# Patient Record
Sex: Female | Born: 1955
Health system: Southern US, Community
[De-identification: ages and names within clinical notes are randomized; demographics above are authoritative.]

## PROBLEM LIST (undated history)

## (undated) DIAGNOSIS — Z8669 Personal history of other diseases of the nervous system and sense organs: Secondary | ICD-10-CM

## (undated) DIAGNOSIS — Z87898 Personal history of other specified conditions: Secondary | ICD-10-CM

## (undated) HISTORY — DX: Personal history of other diseases of the nervous system and sense organs: Z86.69

## (undated) HISTORY — DX: Personal history of other specified conditions: Z87.898

---

## 1999-02-25 ENCOUNTER — Emergency Department (HOSPITAL_COMMUNITY): Admission: EM | Admit: 1999-02-25 | Discharge: 1999-02-25 | Payer: Self-pay

## 2000-05-18 ENCOUNTER — Encounter: Payer: Self-pay | Admitting: Obstetrics and Gynecology

## 2000-05-18 ENCOUNTER — Encounter: Admission: RE | Admit: 2000-05-18 | Discharge: 2000-05-18 | Payer: Self-pay | Admitting: Obstetrics and Gynecology

## 2001-05-20 ENCOUNTER — Encounter: Payer: Self-pay | Admitting: Obstetrics and Gynecology

## 2001-05-20 ENCOUNTER — Encounter: Admission: RE | Admit: 2001-05-20 | Discharge: 2001-05-20 | Payer: Self-pay | Admitting: Obstetrics and Gynecology

## 2001-06-17 ENCOUNTER — Other Ambulatory Visit: Admission: RE | Admit: 2001-06-17 | Discharge: 2001-06-17 | Payer: Self-pay | Admitting: Obstetrics and Gynecology

## 2002-05-26 ENCOUNTER — Encounter: Admission: RE | Admit: 2002-05-26 | Discharge: 2002-05-26 | Payer: Self-pay | Admitting: Obstetrics and Gynecology

## 2002-05-26 ENCOUNTER — Encounter: Payer: Self-pay | Admitting: Obstetrics and Gynecology

## 2002-06-30 ENCOUNTER — Encounter: Payer: Self-pay | Admitting: Family Medicine

## 2002-06-30 LAB — CONVERTED CEMR LAB

## 2002-08-25 ENCOUNTER — Other Ambulatory Visit: Admission: RE | Admit: 2002-08-25 | Discharge: 2002-08-25 | Payer: Self-pay | Admitting: Obstetrics and Gynecology

## 2003-06-29 ENCOUNTER — Encounter: Admission: RE | Admit: 2003-06-29 | Discharge: 2003-06-29 | Payer: Self-pay | Admitting: Obstetrics and Gynecology

## 2003-11-03 ENCOUNTER — Other Ambulatory Visit: Admission: RE | Admit: 2003-11-03 | Discharge: 2003-11-03 | Payer: Self-pay | Admitting: Obstetrics and Gynecology

## 2004-07-18 ENCOUNTER — Encounter: Admission: RE | Admit: 2004-07-18 | Discharge: 2004-07-18 | Payer: Self-pay | Admitting: Obstetrics and Gynecology

## 2004-11-26 ENCOUNTER — Other Ambulatory Visit: Admission: RE | Admit: 2004-11-26 | Discharge: 2004-11-26 | Payer: Self-pay | Admitting: Obstetrics and Gynecology

## 2005-01-02 ENCOUNTER — Ambulatory Visit: Payer: Self-pay | Admitting: Internal Medicine

## 2005-07-24 ENCOUNTER — Encounter: Admission: RE | Admit: 2005-07-24 | Discharge: 2005-07-24 | Payer: Self-pay | Admitting: Obstetrics and Gynecology

## 2005-11-27 ENCOUNTER — Other Ambulatory Visit: Admission: RE | Admit: 2005-11-27 | Discharge: 2005-11-27 | Payer: Self-pay | Admitting: Obstetrics and Gynecology

## 2006-03-27 ENCOUNTER — Ambulatory Visit: Payer: Self-pay | Admitting: Family Medicine

## 2006-04-21 ENCOUNTER — Ambulatory Visit: Payer: Self-pay | Admitting: Family Medicine

## 2006-08-06 ENCOUNTER — Encounter: Admission: RE | Admit: 2006-08-06 | Discharge: 2006-08-06 | Payer: Self-pay | Admitting: Obstetrics and Gynecology

## 2006-12-01 ENCOUNTER — Encounter: Payer: Self-pay | Admitting: Family Medicine

## 2006-12-01 DIAGNOSIS — I10 Essential (primary) hypertension: Secondary | ICD-10-CM | POA: Insufficient documentation

## 2006-12-01 DIAGNOSIS — G43109 Migraine with aura, not intractable, without status migrainosus: Secondary | ICD-10-CM | POA: Insufficient documentation

## 2006-12-03 ENCOUNTER — Ambulatory Visit: Payer: Self-pay | Admitting: Family Medicine

## 2007-03-23 ENCOUNTER — Ambulatory Visit: Payer: Self-pay | Admitting: Family Medicine

## 2007-08-12 ENCOUNTER — Encounter: Admission: RE | Admit: 2007-08-12 | Discharge: 2007-08-12 | Payer: Self-pay | Admitting: Obstetrics and Gynecology

## 2008-08-17 ENCOUNTER — Encounter: Admission: RE | Admit: 2008-08-17 | Discharge: 2008-08-17 | Payer: Self-pay | Admitting: Obstetrics and Gynecology

## 2009-04-12 ENCOUNTER — Encounter: Payer: Self-pay | Admitting: Family Medicine

## 2009-08-23 ENCOUNTER — Encounter: Admission: RE | Admit: 2009-08-23 | Discharge: 2009-08-23 | Payer: Self-pay | Admitting: Obstetrics and Gynecology

## 2009-08-23 IMAGING — MG MM DIGITAL SCREENING
4 series · 4 of 4 positions shown · non-contrast
Comparison: none

DG SCREEN MAMMOGRAM BILATERAL
Bilateral CC and MLO view(s) were taken.

DIGITAL SCREENING MAMMOGRAM WITH CAD:
There are scattered fibroglandular densities.  No masses or malignant type calcifications are 
identified.  Compared with prior studies.
Images were processed with CAD.

[R CC]
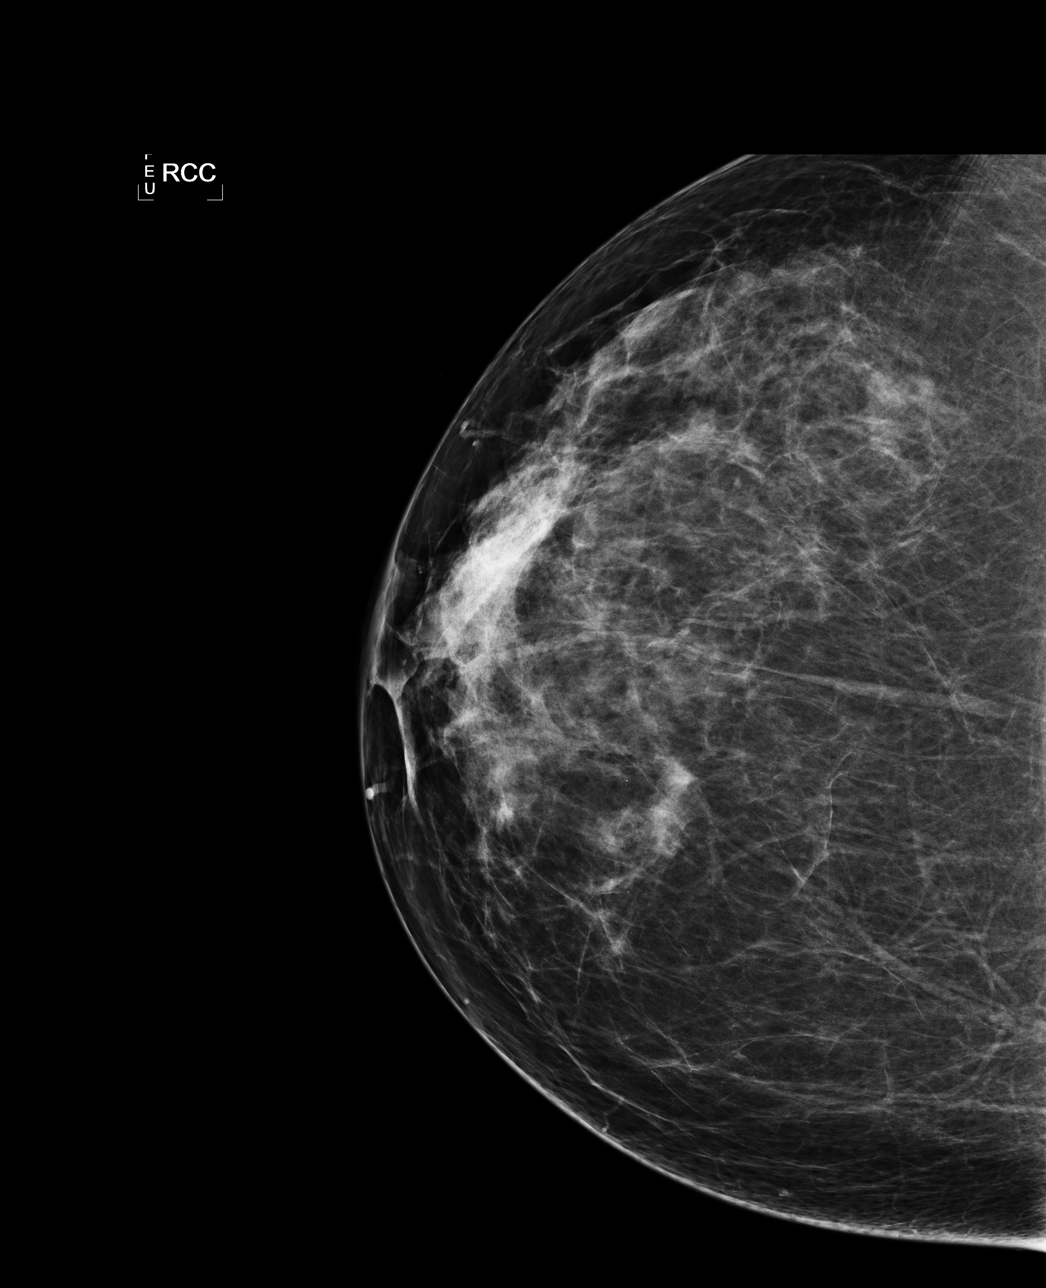

[L CC]
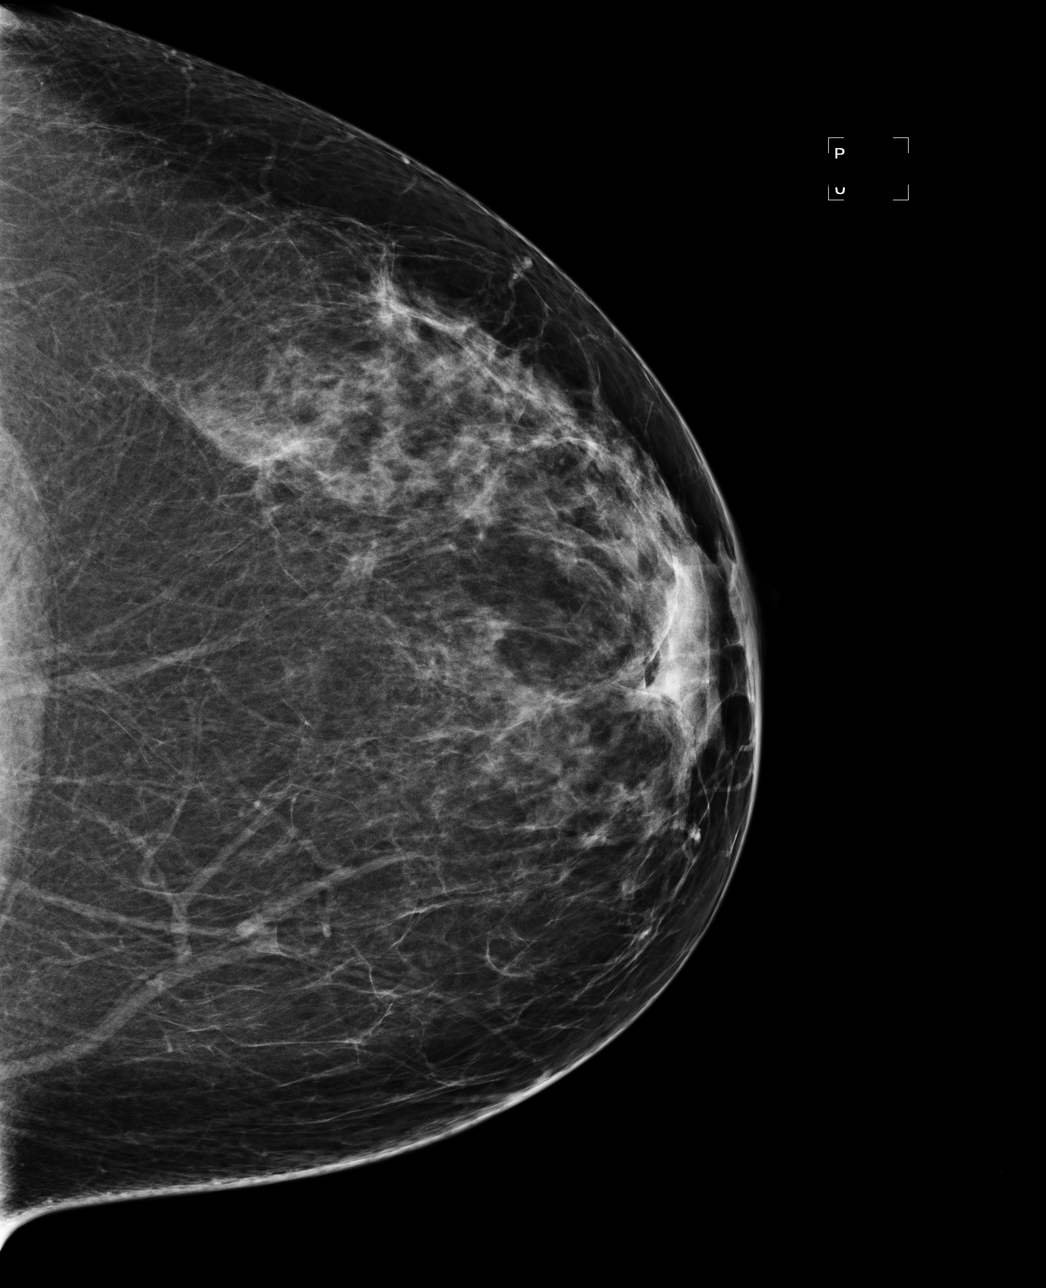

[L MLO]
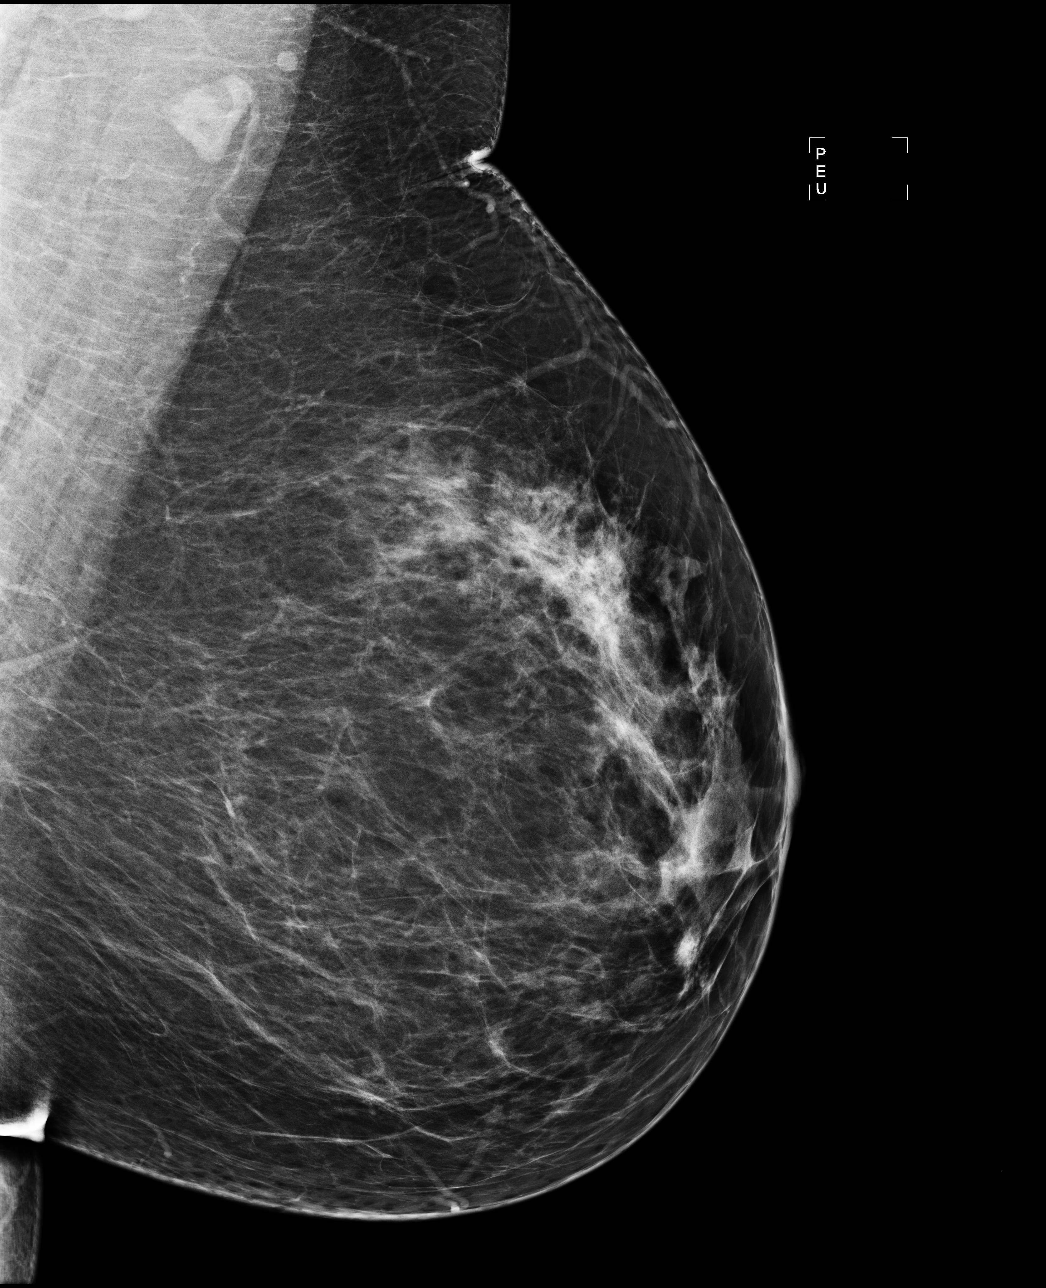

[R MLO]
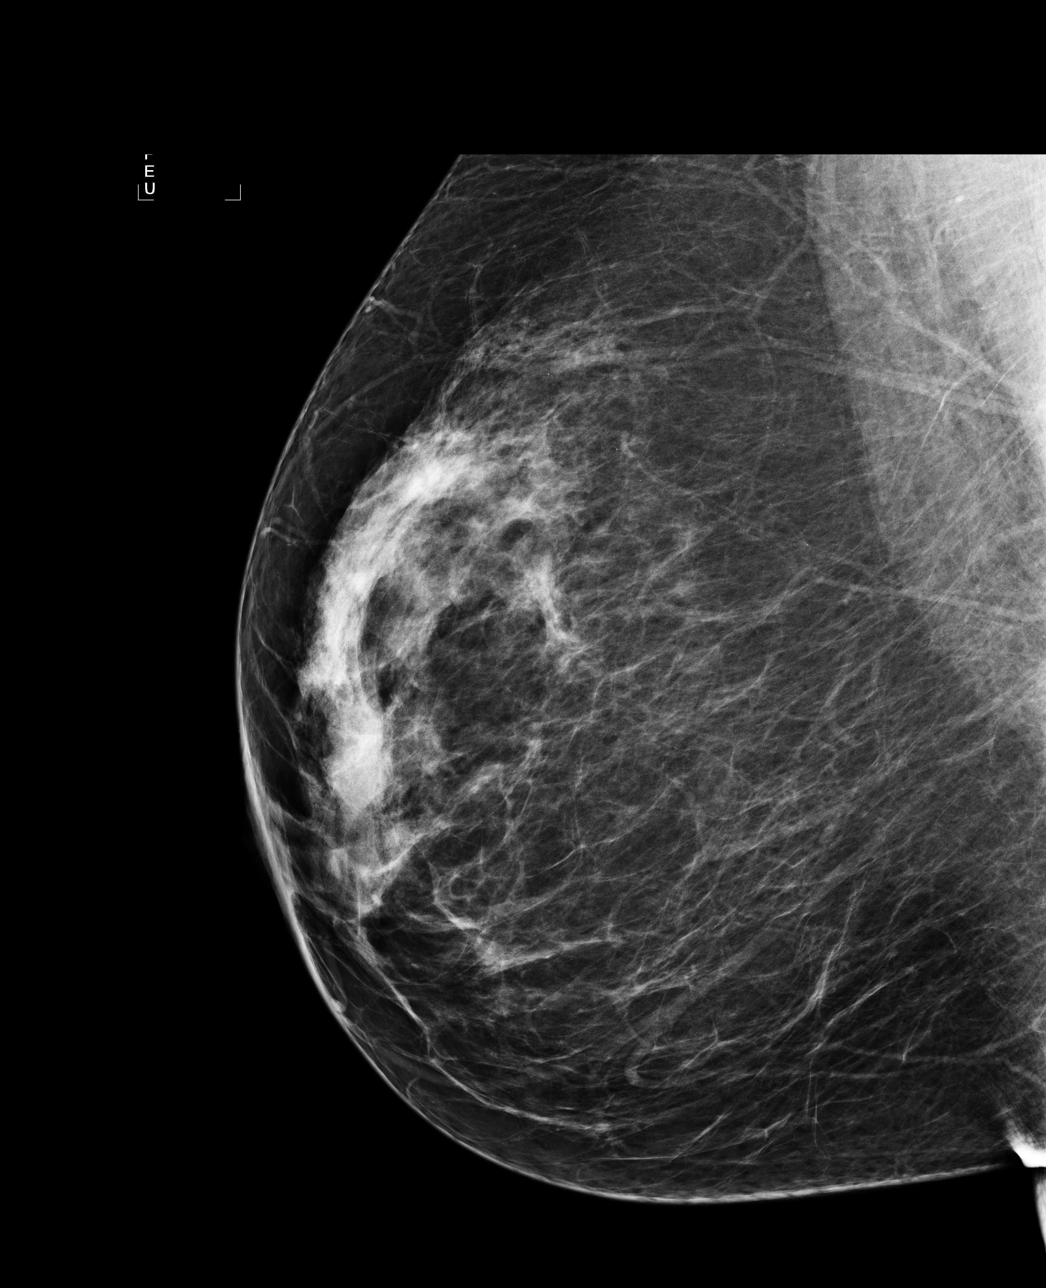

[4 of 4 positions shown; findings below may reference images not displayed]

IMPRESSION: No specific mammographic evidence of malignancy.  Next screening mammogram is recommended in one 
year.

A result letter of this screening mammogram will be mailed directly to the patient.

ASSESSMENT: Negative - BI-RADS 1

Screening mammogram in 1 year.
,

## 2010-04-05 ENCOUNTER — Ambulatory Visit
Admission: RE | Admit: 2010-04-05 | Discharge: 2010-04-05 | Payer: Self-pay | Source: Home / Self Care | Attending: Family Medicine | Admitting: Family Medicine

## 2010-04-05 DIAGNOSIS — H6980 Other specified disorders of Eustachian tube, unspecified ear: Secondary | ICD-10-CM | POA: Insufficient documentation

## 2010-04-05 DIAGNOSIS — H6990 Unspecified Eustachian tube disorder, unspecified ear: Secondary | ICD-10-CM | POA: Insufficient documentation

## 2010-04-23 ENCOUNTER — Telehealth: Payer: Self-pay | Admitting: Family Medicine

## 2010-04-30 NOTE — Assessment & Plan Note (Signed)
Summary: cold/rbh   Vital Signs:  Patient Profile:   55 Years Old Female Weight:      224 pounds Temp:     97.8 degrees F rectal Pulse rate:   68 / minute Pulse rhythm:   regular BP sitting:   116 / 84  (left arm) Cuff size:   large  Vitals Entered By: Lowella Petties (December 03, 2006 11:46 AM)                 Chief Complaint:  URI sxs x 4 days.  History of Present Illness: had a bad cold that started on sat got worse mon and tues took otc cold med- thera flu is overall some better- with some remain congestion no facial pain or fever not a bad cough  low back pain worse on L- new starting 2 weeks ago- no urinary symptoms worse to twist and bend over no rad to legs, no numbness or weakness occ would take some ibuprofen a little better now  Current Allergies (reviewed today): No known allergies      Review of Systems      See HPI  General      Complains of fatigue.      Denies chills, fever, and loss of appetite.  Eyes      Denies eye irritation.  ENT      Denies earache.  Resp      Complains of cough.  GI      Denies change in bowel habits, nausea, and vomiting.  MS      Complains of low back pain.      Denies loss of strength and muscle weakness.  Derm      Denies changes in color of skin and rash.  Neuro      Denies numbness.   Physical Exam  General:     Well-developed,well-nourished,in no acute distress; alert,appropriate and cooperative throughout examination Head:     Normocephalic and atraumatic without obvious abnormalities. No apparent alopecia or balding. noo sinus tenderness Eyes:     vision grossly intact, pupils equal, pupils round, pupils reactive to light, and no injection.   Ears:     R ear normal and L ear normal.   Nose:     mucosal erythema and mucosal edema.   Mouth:     pharynx pink and moist.   Neck:     No deformities, masses, or tenderness noted.supple and no masses.   Chest Wall:     No deformities,  masses, or tenderness noted. Lungs:     Normal respiratory effort, chest expands symmetrically. Lungs are clear to auscultation, no crackles or wheezes. Heart:     Normal rate and regular rhythm. S1 and S2 normal without gallop, murmur, click, rub or other extra sounds. Msk:     no LS bony tenderness nl rom with pain to fully lat flex L and twist some mild L perimuscular tenderness neg SLR Pulses:     R and L carotid,radial,femoral,dorsalis pedis and posterior tibial pulses are full and equal bilaterally Extremities:     No clubbing, cyanosis, edema, or deformity noted with normal full range of motion of all joints.   Neurologic:     strength normal in all extremities, sensation intact to light touch, gait normal, and DTRs symmetrical and normal.   Skin:     turgor normal, color normal, and no rashes.   Cervical Nodes:     No lymphadenopathy noted Psych:  nl affect, cheerful    Impression & Recommendations:  Problem # 1:  BACKACHE NOS (ICD-724.5) somewhat improved in past 2 weeks, suspect muscle strain will try heat and otc aleve as needed stretches taught if no further imp in 2 weeks, call  Problem # 2:  U R I (ICD-465.9) suspect viral and improved will try nasal saline spray and mucinex for congestion if no imp next week will call ( has vacation planned next weekend)  Complete Medication List: 1)  Imitrex Statdose System 4 Mg/0.56ml Kit (Sumatriptan succinate) .... Use as directed   Patient Instructions: 1)  drink lots of fluids and try nasal saline spray and mucinex over the counter as directed for congestion 2)  if you are not better by mid next week- please call 3)  if you get bad sinus pain or fever- let me know 4)  for your back- keep stretched out- try some heat, and you can take 2 aleve twice daily as needed with food-if not improving in 2 weeks let me know     Prior Medications (reviewed today): IMITREX STATDOSE SYSTEM 4 MG/0.5ML  KIT (SUMATRIPTAN  SUCCINATE) use as directed Current Allergies (reviewed today): No known allergies

## 2010-04-30 NOTE — Assessment & Plan Note (Signed)
Summary: chest cong./bir   Vital Signs:  Patient Profile:   55 Years Old Female Weight:      225 pounds Temp:     97.9 degrees F oral Pulse rate:   68 / minute Pulse rhythm:   regular BP sitting:   118 / 80  (left arm) Cuff size:   large  Vitals Entered By: Lowella Petties (March 23, 2007 12:46 PM)                 Chief Complaint:  Cough and congestion.  History of Present Illness: started symptoms 1 week ago- after fatigue had bad body aches- took day off of work has been resting as much as possible this week body aches lasted 2 days- now bad chest congestion and cough uses nyquil now a little runny nose- not much congestion some hoarseness- and no more chills   Current Allergies: No known allergies    Social History:    Marital Status: single    Children: none    Occupation: [employer]    former smoker    Review of Systems      See HPI  General      Complains of chills, fatigue, and fever.  Eyes      Complains of eye irritation.      eyes were draining  ENT      Complains of hoarseness, postnasal drainage, and sore throat.      Denies earache.      mild sore throat   Resp      Complains of cough and sputum productive.      not much phlegm- ? if color to it  GI      Denies diarrhea, nausea, and vomiting.   Physical Exam  General:     Well-developed,well-nourished,in no acute distress; alert,appropriate and cooperative throughout examination Head:     normocephalic, atraumatic, and no abnormalities observed.  no sinus congestion Eyes:     vision grossly intact, pupils equal, pupils round, pupils reactive to light, and no injection.   Ears:     R ear normal and L ear normal.   Nose:     mucosal edema.   Mouth:     pharynx pink and moist.  - some post nasal drip Neck:     supple with full rom and no masses or thyromegally, no JVD or carotid bruit  Lungs:     harsh bs at bases with rhonchi and end exp wheeze (mild) no rales or  crackles no prolonged exp phase Heart:     Normal rate and regular rhythm. S1 and S2 normal without gallop, murmur, click, rub or other extra sounds. Skin:     Intact without suspicious lesions or rashes Cervical Nodes:     No lymphadenopathy noted Psych:     nl affect, pleasant    Impression & Recommendations:  Problem # 1:  BRONCHITIS-ACUTE (ICD-466.0) will tx with ceftin and update guifen/codine cough syrup with caution for cough will update if not imp within a week Her updated medication list for this problem includes:    Ceftin 500 Mg Tabs (Cefuroxime axetil) .Marland Kitchen... 1 by mouth two times a day for 10 days    Guaifenesin-codeine 100-10 Mg/35ml Liqd (Guaifenesin-codeine) .Marland Kitchen... 1-2 tsp by mouth q 4-6 hours as needed cough   Complete Medication List: 1)  Imitrex Statdose System 4 Mg/0.81ml Kit (Sumatriptan succinate) .... Use as directed 2)  Ceftin 500 Mg Tabs (Cefuroxime axetil) .Marland Kitchen.. 1 by mouth two times  a day for 10 days 3)  Guaifenesin-codeine 100-10 Mg/68ml Liqd (Guaifenesin-codeine) .Marland Kitchen.. 1-2 tsp by mouth q 4-6 hours as needed cough   Patient Instructions: 1)  take ceftin as directed for bronchitis 2)  drink lots of fluids and get rest 3)  use caution with robitussin with codiene- because it can sedate 4)  if worse or high fever or shortness of breath- call or seek care    Prescriptions: GUAIFENESIN-CODEINE 100-10 MG/5ML  LIQD (GUAIFENESIN-CODEINE) 1-2 tsp by mouth q 4-6 hours as needed cough  #120 cc x 0   Entered and Authorized by:   Judith Part MD   Signed by:   Judith Part MD on 03/23/2007   Method used:   Print then Give to Patient   RxID:   [id number] CEFTIN 500 MG  TABS (CEFUROXIME AXETIL) 1 by mouth two times a day for 10 days  #20 x 0   Entered and Authorized by:   Judith Part MD   Signed by:   Judith Part MD on 03/23/2007   Method used:   Print then Give to Patient   RxID:   [id number]  ]

## 2010-04-30 NOTE — Miscellaneous (Signed)
Summary: [facility]  [facility]   Imported By: Lanelle Bal 04/18/2009 08:13:09  _____________________________________________________________________  External Attachment:    Type:   Image     Comment:   External Document

## 2010-05-02 NOTE — Assessment & Plan Note (Signed)
Summary: ear pain/ get reestablished/alc   Vital Signs:  Patient profile:   55 year old female Weight:      233 pounds Temp:     97.9 degrees F oral Pulse rate:   72 / minute Pulse rhythm:   regular BP sitting:   90 / 50  (left arm) Cuff size:   large  Vitals Entered By: Lowella Petties CMA, AAMA (April 05, 2010 12:33 PM) CC: Pain behind ears x 3 months   History of Present Illness: is having pain in L ear -- feels blocked up  since end of sept  went to [facility] then -- dx with eustacian tube dysf  planned to observe and never got better  bought mucinex otc and tried saline spray   did try some ear drops --otc for homeopathic - no improvement   no air travel or diving   occ pollen bothers her eyes  not exp to smoke any longer (seldom )       Allergies (verified): No Known Drug Allergies  Past History:  Past Medical History: Last updated: 12/01/2006 Hypertension  Past Surgical History: Last updated: 12/01/2006 Colonoscopy- neg (05/2006)  Family History: Last updated: 12/01/2006 Father:  Mother:  Siblings: none  Social History: Last updated: 03/23/2007 Marital Status: single Children: none Occupation: [employer] former smoker  Risk Factors: Smoking Status: quit (12/01/2006)  Review of Systems General:  Denies chills, fatigue, fever, loss of appetite, and malaise. Eyes:  Denies blurring, discharge, and eye irritation. ENT:  Complains of decreased hearing, earache, and postnasal drainage; denies ear discharge. CV:  Denies chest pain or discomfort and lightheadness. Resp:  Denies cough and shortness of breath. Neuro:  Denies sensation of room spinning. Endo:  Denies cold intolerance and heat intolerance.  Physical Exam  General:  overweight but generally well appearing  Head:  normocephalic, atraumatic, and no abnormalities observed.  no sinus tenderness  Eyes:  vision grossly intact, pupils equal, pupils round, and pupils reactive to light.  no  conjunctival pallor, injection or icterus  Ears:  TMs bilateral- scarring  some dullness but no eff noted dec hearing to whisper in L ear  Nose:  no nasal discharge.   Mouth:  pharynx pink and moist.   Neck:  supple with full rom and no masses or thyromegally, no JVD or carotid bruit  Lungs:  Normal respiratory effort, chest expands symmetrically. Lungs are clear to auscultation, no crackles or wheezes. Heart:  Normal rate and regular rhythm. S1 and S2 normal without gallop, murmur, click, rub or other extra sounds. Skin:  Intact without suspicious lesions or rashes Cervical Nodes:  No lymphadenopathy noted Psych:  normal affect, talkative and pleasant    Impression & Recommendations:  Problem # 1:  EUSTACHIAN TUBE DYSFUNCTION, LEFT (ICD-381.81) Assessment New  following a cold in sept and not improving  pressure and dec hearing - but not pain or redness  will try flonase ns inst how to use  update in 2-3 wk- if not imp will ref to ENT  Orders: Prescription Created Electronically [id number])  Complete Medication List: 1)  Imitrex 100 Mg Tabs (Sumatriptan succinate) .Marland Kitchen.. 1 by mouth once for headache as needed  can repeat once in 2 hours if needed 2)  Flonase 50 Mcg/act Susp (Fluticasone propionate) .... 2 sprays in each nostril once daily  Patient Instructions: 1)  use the flonase nasal spray as directed  2)  if not improved in 2-3 weeks - please call and leave me a message  and I will do ENT doctor referral  Prescriptions: IMITREX 100 MG TABS (SUMATRIPTAN SUCCINATE) 1 by mouth once for headache as needed  can repeat once in 2 hours if needed  #12 x 5   Entered and Authorized by:   Judith Part MD   Signed by:   Judith Part MD on 04/05/2010   Method used:   Electronically to        [facility] [phone]* (retail)       [address]       [address]       [place], [place]  [address]       Ph: [phone]       Fax: [phone]   RxID:    [id number] FLONASE 50 MCG/ACT SUSP (FLUTICASONE PROPIONATE) 2 sprays in each nostril once daily  #1 mdi x 11   Entered and Authorized by:   Judith Part MD   Signed by:   Judith Part MD on 04/05/2010   Method used:   Electronically to        [facility] [phone]* (retail)       [address]       [address]       [place], [place]  [address]       Ph: [phone]       Fax: [phone]   RxID:   [id number]    Orders Added: 1)  Prescription Created Electronically [G8553] 2)  Est. Patient Level III [55732]    Prior Medications: IMITREX 100 MG TABS (SUMATRIPTAN SUCCINATE) 1 by mouth once for headache as needed  can repeat once in 2 hours if needed FLONASE 50 MCG/ACT SUSP (FLUTICASONE PROPIONATE) 2 sprays in each nostril once daily Current Allergies (reviewed today): No known allergies

## 2010-05-02 NOTE — Progress Notes (Signed)
Summary: ear is not any better  Phone Note Call from Patient Call back at [phone]   Caller: Patient Call For: Judith Part MD Summary of Call: Pt was seen about 2 weeks ago for her left ear, which was stopped up.  It has not gotten any better and she was told to call if not better.  It you refer to ENT she prefers to go to [facility]. Initial call taken by: Lowella Petties CMA, AAMA,  April 23, 2010 8:16 AM  Follow-up for Phone Call        will do ref for [facility] Follow-up by: Judith Part MD,  April 23, 2010 8:45 AM

## 2010-05-06 ENCOUNTER — Other Ambulatory Visit: Payer: Self-pay | Admitting: Otolaryngology

## 2010-05-06 DIAGNOSIS — H905 Unspecified sensorineural hearing loss: Secondary | ICD-10-CM

## 2010-05-09 ENCOUNTER — Other Ambulatory Visit: Payer: Self-pay

## 2010-07-25 ENCOUNTER — Other Ambulatory Visit: Payer: Self-pay | Admitting: Obstetrics and Gynecology

## 2010-07-25 DIAGNOSIS — Z1231 Encounter for screening mammogram for malignant neoplasm of breast: Secondary | ICD-10-CM

## 2010-09-05 ENCOUNTER — Ambulatory Visit: Payer: Self-pay

## 2010-09-17 ENCOUNTER — Ambulatory Visit: Payer: Self-pay

## 2010-09-26 ENCOUNTER — Ambulatory Visit
Admission: RE | Admit: 2010-09-26 | Discharge: 2010-09-26 | Disposition: A | Discharge status: Home / Self Care | Payer: BC Managed Care – PPO | Source: Ambulatory Visit | Attending: Obstetrics and Gynecology | Admitting: Obstetrics and Gynecology

## 2010-09-26 DIAGNOSIS — Z1231 Encounter for screening mammogram for malignant neoplasm of breast: Secondary | ICD-10-CM

## 2013-06-27 ENCOUNTER — Telehealth: Payer: Self-pay

## 2013-06-27 MED ORDER — RIZATRIPTAN BENZOATE 10 MG PO TABS: 10.0000 mg | ORAL_TABLET | ORAL | Status: DC | PRN

## 2013-06-27 NOTE — Telephone Encounter (Signed)
Pt said for several months pt has been in [place] taking care of her parents; pt will return to [place] end of April for 2 weeks; pt is having migraine headache and does not want to go to UC due to pt not having ins. Pt [facility] Maxalt 10 [facility] [place] [phone]. Pt last seen 04/05/2010.Marland Kitchen.Please advise.

## 2013-06-27 NOTE — Telephone Encounter (Signed)
Please call in Maxalt Make sure she has appt after she returns

## 2013-06-28 NOTE — Telephone Encounter (Signed)
RX phoned in to pharmacy. Left vm letting pt know rx had been phoned in.

## 2013-08-01 ENCOUNTER — Ambulatory Visit (INDEPENDENT_AMBULATORY_CARE_PROVIDER_SITE_OTHER): Payer: Self-pay | Admitting: Family Medicine

## 2013-08-01 ENCOUNTER — Encounter: Payer: Self-pay | Admitting: Family Medicine

## 2013-08-01 VITALS — BP 114/80 | HR 73 | Temp 98.5°F | Ht 69.0 in | Wt 218.8 lb

## 2013-08-01 DIAGNOSIS — Z87898 Personal history of other specified conditions: Secondary | ICD-10-CM

## 2013-08-01 MED ORDER — RIZATRIPTAN BENZOATE 10 MG PO TABS: 10.0000 mg | ORAL_TABLET | ORAL | Status: DC | PRN

## 2013-08-01 NOTE — Progress Notes (Signed)
Pre visit review using our clinic review tool, if applicable. No additional management support is needed unless otherwise documented below in the visit note. 

## 2013-08-01 NOTE — Progress Notes (Signed)
Subjective:    Patient ID: Claudia Mcguire, female    DOB: January 11, 1956, 58 y.o.   MRN: [id number]  HPI Here to re establish   She has lost a job and then started working at [employer]- and then had to quit to take care of her parents in [place] Visits here several times a year and keeps a house here   Caring for her mother with dementia and her father who is 96-in [place]   Goes to [employer] and has lost weight - she really enjoys it  Down 15 lb by this chart  Goal to get at 200 or below   Plans to get started on yard work  Flies back to [place] in a week and will come back in the fall   Pap 2013 - does not have to go back (gets one every 3 years) Mammogram 2013 - does not want to do that yet - no insurance ( not always doing self breast exams)  Td 2009  Flu shot 2014 Colon screen 2006   - had a colonoscopy   Feels like she does not hear as well  Has had less migraines - as she gets older    Needs refill of maxalt-does not take often   Patient Active Problem List   Diagnosis Date Noted  . EUSTACHIAN TUBE DYSFUNCTION, LEFT 04/05/2010  . MIGRAINES, HX OF 12/01/2006   Past Medical History  Diagnosis Date  . History of headache   . History of migraine    No past surgical history on file. History  Substance Use Topics  . Smoking status: Former Games developermoker  . Smokeless tobacco: Never Used     Comment: quit back in 1987  . Alcohol Use: No     Comment: socially   Family History  Problem Relation Age of Onset  . Hyperlipidemia Father   . Stroke Paternal Grandfather   . Stroke Paternal Uncle     2 uncles  . Diabetes Paternal Grandfather    No Known Allergies No current outpatient prescriptions on file prior to visit.   No current facility-administered medications on file prior to visit.    Review of Systems Review of Systems  Constitutional: Negative for fever, appetite change, fatigue and unexpected weight change.  Eyes: Negative for pain and visual disturbance.    Respiratory: Negative for cough and shortness of breath.   Cardiovascular: Negative for cp or palpitations    Gastrointestinal: Negative for nausea, diarrhea and constipation.  Genitourinary: Negative for urgency and frequency.  Skin: Negative for pallor or rash   Neurological: Negative for weakness, light-headedness, numbness and pos for  headaches.  Hematological: Negative for adenopathy. Does not bruise/bleed easily.  Psychiatric/Behavioral: Negative for dysphoric mood. The patient is not nervous/anxious. Pos for stressors         Objective:   Physical Exam  Constitutional: She appears well-developed and well-nourished. No distress.  HENT:  Head: Normocephalic and atraumatic.  Right Ear: External ear normal.  Left Ear: External ear normal.  Nose: Nose normal.  Mouth/Throat: Oropharynx is clear and moist.  TMs are clear   Eyes: Conjunctivae and EOM are normal. Pupils are equal, round, and reactive to light. Right eye exhibits no discharge. Left eye exhibits no discharge. No scleral icterus.  Neck: Normal range of motion. Neck supple. No JVD present. No thyromegaly present.  Cardiovascular: Normal rate, regular rhythm, normal heart sounds and intact distal pulses.  Exam reveals no gallop.   Pulmonary/Chest: Effort normal and  breath sounds normal. No respiratory distress. She has no wheezes. She has no rales.  Abdominal: Soft. Bowel sounds are normal. She exhibits no distension and no mass. There is no tenderness.  Musculoskeletal: She exhibits no edema and no tenderness.  Lymphadenopathy:    She has no cervical adenopathy.  Neurological: She is alert. She has normal reflexes. No cranial nerve deficit. She exhibits normal muscle tone. Coordination normal.  Skin: Skin is warm and dry. No rash noted. No erythema. No pallor.  Psychiatric: She has a normal mood and affect.          Assessment & Plan:

## 2013-08-01 NOTE — Assessment & Plan Note (Signed)
Overall much better post menopausal Still take occ maxalt generic  Refilled that for the year [place] lives in [place])- will be able to f/u yearly and will do health mt when she gets ins in [place]

## 2013-08-01 NOTE — Patient Instructions (Signed)
Take care of yourself  Great job with diet and exercise and weight loss  Let me know if migraines become more of a problem

## 2017-04-06 ENCOUNTER — Telehealth: Payer: Self-pay | Admitting: Family Medicine

## 2017-04-06 NOTE — Telephone Encounter (Signed)
See below crm  Pt established with you 2015.  Only had one visit with you  Ok to schedule?  Copied from CRM #[id number]. Topic: Quick Communication - See Telephone Encounter >> Apr 06, 2017 12:15 PM Elliot GaultBell, Tiffany M wrote: CRM for notification. See Telephone encounter for:   04/06/17.  Relation to pt: self  Call back [phone]   Reason for call:  Patient would like to re establish care with Dr. Milinda Antisower, patient states she's currently taking care of her parents therefore she's constantly out of town, please advise regarding NPA appointment.

## 2017-04-06 NOTE — Telephone Encounter (Signed)
That is ok -please schedule a 30 min visit

## 2017-04-07 NOTE — Telephone Encounter (Signed)
Please refill maxalt times one  Thanks

## 2017-04-07 NOTE — Telephone Encounter (Signed)
I spoke with pt and set up an appt to re-est in May- when she is in town next. She requested a refill of the Maxalt for migraines if possible. She was aware this may not be possible due to not being seen in 3 years.

## 2017-04-08 MED ORDER — RIZATRIPTAN BENZOATE 10 MG PO TABS: 10.0000 mg | ORAL_TABLET | ORAL | 1 refills | Status: DC | PRN

## 2017-04-08 NOTE — Telephone Encounter (Signed)
Called pt and got her updated pharmacy and Rx sent, pt aware

## 2017-08-04 ENCOUNTER — Encounter: Payer: Self-pay | Admitting: Family Medicine

## 2017-08-04 ENCOUNTER — Ambulatory Visit (INDEPENDENT_AMBULATORY_CARE_PROVIDER_SITE_OTHER): Payer: Self-pay | Admitting: Family Medicine

## 2017-08-04 VITALS — BP 116/80 | HR 67 | Temp 98.2°F | Ht 68.75 in | Wt 216.5 lb

## 2017-08-04 DIAGNOSIS — M8580 Other specified disorders of bone density and structure, unspecified site: Secondary | ICD-10-CM | POA: Insufficient documentation

## 2017-08-04 DIAGNOSIS — M85859 Other specified disorders of bone density and structure, unspecified thigh: Secondary | ICD-10-CM

## 2017-08-04 DIAGNOSIS — G43109 Migraine with aura, not intractable, without status migrainosus: Secondary | ICD-10-CM

## 2017-08-04 DIAGNOSIS — Z131 Encounter for screening for diabetes mellitus: Secondary | ICD-10-CM

## 2017-08-04 DIAGNOSIS — Z13220 Encounter for screening for lipoid disorders: Secondary | ICD-10-CM

## 2017-08-04 DIAGNOSIS — Z23 Encounter for immunization: Secondary | ICD-10-CM

## 2017-08-04 LAB — GLUCOSE, RANDOM: Glucose, Bld: 86 mg/dL (ref 70–99)

## 2017-08-04 LAB — LIPID PANEL
Cholesterol: 158 mg/dL (ref 0–200)
HDL: 67.8 mg/dL (ref >39.00)
LDL Cholesterol: 73 mg/dL (ref 0–99)
NonHDL: 90.07
Total CHOL/HDL Ratio: 2
Triglycerides: 83 mg/dL (ref 0.0–149.0)
VLDL: 16.6 mg/dL (ref 0.0–40.0)

## 2017-08-04 MED ORDER — RIZATRIPTAN BENZOATE 10 MG PO TABS: 10.0000 mg | ORAL_TABLET | ORAL | 11 refills | Status: DC | PRN

## 2017-08-04 NOTE — Progress Notes (Signed)
Subjective:    Patient ID: Claudia Mcguire, female    DOB: 05/08/1955, 62 y.o.   MRN: [id number]  HPI Here to re establish for primary care   Used to live in [place] and traveled here occasionally  Still does  Caring for her parents  Father just turned 6  Mother has dementia pretty bad (getting palliative care help)   Does not have insurance - at all  Hanging on for medicare   Just had ingrown toe nail op on - on keflex and doing well   Was going to [facility] -it closed  She walks when she can for exercise  Joined [employer]/ hates it  Yoga - is interested in   She eats fairly healthy   Last visit 5/15-no care at all since   [facility] Readings from Last 3 Encounters:  08/04/17 216 lb 8 oz (98.2 kg)  08/01/13 218 lb 12 oz (99.2 kg)  04/05/10 (!) 233 lb (105.7 kg)  fairly stable  32.20 kg/m   BP Readings from Last 3 Encounters:  08/04/17 116/80  08/01/13 114/80  04/05/10 (!) 90/50   Pulse Readings from Last 3 Encounters:  08/04/17 67  08/01/13 73  04/05/10 72   Hx of migraines  Does not get them as often in menopause (2-3 per month)  When she does - aura occ  Mod pain (pain scale 8/10) - lasting for a whole day if she does not take maxalt  Photo phobia and phono phobia  maxalt helps a lot - needs to stay refilled      Health mt issues -not attending to based on lack of insurance  Hep C/HIV screening   Mammogram -will wait for medicare  Self breast exam   Gyn care -waiting for medicare   Colonoscopy 1/06 -will wait for medicare   Tetanus shot 1/09 -wants today   Flu vaccines -gets those every year   Had a bone density test 5/08 = osteopenia in L femoral neck with T score of -1.5  Will wait for medicare   Interested in diabetes screening (glucose)   (had yogurt and granola today)  Also lipid profile    Patient Active Problem List   Diagnosis Date Noted  . Osteopenia 08/04/2017  . Diabetes mellitus screening 08/04/2017  . Screening for lipoid  disorders 08/04/2017  . Migraine with aura 12/01/2006   Past Medical History:  Diagnosis Date  . History of headache   . History of migraine    History reviewed. No pertinent surgical history. Social History   Tobacco Use  . Smoking status: Former Games developer  . Smokeless tobacco: Never Used  . Tobacco comment: quit back in 1987  Substance Use Topics  . Alcohol use: No    Comment: socially  . Drug use: No   Family History  Problem Relation Age of Onset  . Hyperlipidemia Father   . Stroke Paternal Grandfather   . Stroke Paternal Uncle        2 uncles  . Diabetes Paternal Grandfather    No Known Allergies Current Outpatient Medications on File Prior to Visit  Medication Sig Dispense Refill  . Cephalexin (KEFLEX PO) Take 1 capsule by mouth 2 (two) times daily.     No current facility-administered medications on file prior to visit.     Review of Systems  Constitutional: Negative for activity change, appetite change, fatigue, fever and unexpected weight change.  HENT: Negative for congestion, ear pain, rhinorrhea, sinus pressure and sore throat.  Eyes: Negative for pain, redness and visual disturbance.  Respiratory: Negative for cough, shortness of breath and wheezing.   Cardiovascular: Negative for chest pain and palpitations.  Gastrointestinal: Negative for abdominal pain, blood in stool, constipation and diarrhea.  Endocrine: Negative for polydipsia and polyuria.  Genitourinary: Negative for dysuria, frequency and urgency.  Musculoskeletal: Negative for arthralgias, back pain and myalgias.  Skin: Negative for pallor and rash.  Allergic/Immunologic: Negative for environmental allergies.  Neurological: Positive for headaches. Negative for dizziness and syncope.  Hematological: Negative for adenopathy. Does not bruise/bleed easily.  Psychiatric/Behavioral: Negative for decreased concentration and dysphoric mood. The patient is not nervous/anxious.        Objective:    Physical Exam  Constitutional: She appears well-developed and well-nourished. No distress.  obese and well appearing   HENT:  Head: Normocephalic and atraumatic.  Mouth/Throat: Oropharynx is clear and moist.  Eyes: Pupils are equal, round, and reactive to light. Conjunctivae and EOM are normal.  Neck: Normal range of motion. Neck supple. No JVD present. Carotid bruit is not present. No thyromegaly present.  Cardiovascular: Normal rate, regular rhythm, normal heart sounds and intact distal pulses. Exam reveals no gallop.  Pulmonary/Chest: Effort normal and breath sounds normal. No respiratory distress. She has no wheezes. She has no rales.  No crackles  Abdominal: Soft. Bowel sounds are normal. She exhibits no distension, no abdominal bruit and no mass. There is no tenderness.  Musculoskeletal: She exhibits no edema.  Lymphadenopathy:    She has no cervical adenopathy.  Neurological: She is alert. She has normal reflexes. She displays normal reflexes. No cranial nerve deficit or sensory deficit. She exhibits normal muscle tone. Coordination normal.  Skin: Skin is warm and dry. No rash noted.  Psychiatric: She has a normal mood and affect.  Pleasant           Assessment & Plan:   Problem List Items Addressed This Visit      Cardiovascular and Mediastinum   Migraine with aura - Primary    Pt is having these less (with aura) as she gets older However they can still be severe  maxalt helps-this was refilled Disc avoidance of analgesic rebound Also headache lifestyle habits-good water intake and reduced caffeine and nl sleep pattern      Relevant Medications   rizatriptan (MAXALT) 10 MG tablet     Musculoskeletal and Integument   Osteopenia    No insurance- will put off dexa until she has medicare Disc imp of ca and D        Other   Diabetes mellitus screening    Random glucose level today      Relevant Orders   Glucose, Random (Completed)   Screening for lipoid  disorders    Non fasting lipid panel today  Disc low sat/trans fat diet and exercise      Relevant Orders   Lipid panel (Completed)    Other Visit Diagnoses    Need for Tdap vaccination       Relevant Orders   Tdap vaccine greater than or equal to 7yo IM (Completed)

## 2017-08-04 NOTE — Patient Instructions (Addendum)
Increase your vitamin D to 2000 iu daily for bone health   Labs for glucose and lipids today   Take care of yourself   Refilled maxalt as well   Limit caffeine/drink lots of water    Tetanus shot today

## 2017-08-05 ENCOUNTER — Other Ambulatory Visit: Payer: Self-pay | Admitting: Primary Care

## 2017-08-05 ENCOUNTER — Encounter: Payer: Self-pay | Admitting: Family Medicine

## 2017-08-05 NOTE — Assessment & Plan Note (Signed)
Random glucose level today

## 2017-08-05 NOTE — Assessment & Plan Note (Signed)
Non fasting lipid panel today  Disc low sat/trans fat diet and exercise

## 2017-08-05 NOTE — Assessment & Plan Note (Signed)
No insurance- will put off dexa until she has medicare Disc imp of ca and D

## 2017-08-05 NOTE — Assessment & Plan Note (Signed)
Pt is having these less (with aura) as she gets older However they can still be severe  maxalt helps-this was refilled Disc avoidance of analgesic rebound Also headache lifestyle habits-good water intake and reduced caffeine and nl sleep pattern

## 2017-08-09 ENCOUNTER — Telehealth: Payer: Self-pay | Admitting: Family Medicine

## 2017-08-09 DIAGNOSIS — Z0000 Encounter for general adult medical examination without abnormal findings: Secondary | ICD-10-CM

## 2017-08-09 NOTE — Telephone Encounter (Signed)
-----   Message from Doreene Nest, NP sent at 08/05/2017  9:35 AM EDT ----- Regarding: FW: Lab orders for Monday, 5.13.19 To PCP ----- Message ----- From: Alvina Chou Sent: 08/04/2017  11:22 AM To: Doreene Nest, NP Subject: Lab orders for Monday, 5.13.19                 Patient is scheduled for CPX labs, please order future labs, Thanks , Camelia Eng

## 2018-10-28 DIAGNOSIS — E119 Type 2 diabetes mellitus without complications: Secondary | ICD-10-CM | POA: Diagnosis not present

## 2018-10-28 DIAGNOSIS — H524 Presbyopia: Secondary | ICD-10-CM | POA: Diagnosis not present

## 2018-10-28 DIAGNOSIS — R69 Illness, unspecified: Secondary | ICD-10-CM | POA: Diagnosis not present

## 2018-12-01 DIAGNOSIS — R69 Illness, unspecified: Secondary | ICD-10-CM | POA: Diagnosis not present

## 2018-12-09 ENCOUNTER — Other Ambulatory Visit: Payer: Self-pay

## 2018-12-09 ENCOUNTER — Ambulatory Visit (INDEPENDENT_AMBULATORY_CARE_PROVIDER_SITE_OTHER): Payer: Medicare HMO | Admitting: Family Medicine

## 2018-12-09 ENCOUNTER — Encounter: Payer: Self-pay | Admitting: Family Medicine

## 2018-12-09 VITALS — BP 126/86 | HR 77 | Temp 97.7°F | Ht 68.75 in | Wt 219.6 lb

## 2018-12-09 DIAGNOSIS — Z1231 Encounter for screening mammogram for malignant neoplasm of breast: Secondary | ICD-10-CM

## 2018-12-09 DIAGNOSIS — M85859 Other specified disorders of bone density and structure, unspecified thigh: Secondary | ICD-10-CM | POA: Diagnosis not present

## 2018-12-09 DIAGNOSIS — Z0000 Encounter for general adult medical examination without abnormal findings: Secondary | ICD-10-CM | POA: Diagnosis not present

## 2018-12-09 DIAGNOSIS — Z23 Encounter for immunization: Secondary | ICD-10-CM

## 2018-12-09 DIAGNOSIS — Z1211 Encounter for screening for malignant neoplasm of colon: Secondary | ICD-10-CM

## 2018-12-09 DIAGNOSIS — E2839 Other primary ovarian failure: Secondary | ICD-10-CM

## 2018-12-09 DIAGNOSIS — Z1159 Encounter for screening for other viral diseases: Secondary | ICD-10-CM

## 2018-12-09 LAB — CBC WITH DIFFERENTIAL/PLATELET
Basophils Absolute: 0.1 10*3/uL (ref 0.0–0.1)
Basophils Relative: 1 % (ref 0.0–3.0)
Eosinophils Absolute: 0.2 10*3/uL (ref 0.0–0.7)
Eosinophils Relative: 3.9 % (ref 0.0–5.0)
HCT: 40.7 % (ref 36.0–46.0)
Hemoglobin: 13.5 g/dL (ref 12.0–15.0)
Lymphocytes Relative: 40.8 % (ref 12.0–46.0)
Lymphs Abs: 2.1 10*3/uL (ref 0.7–4.0)
MCHC: 33.2 g/dL (ref 30.0–36.0)
MCV: 91.5 fl (ref 78.0–100.0)
Monocytes Absolute: 0.5 10*3/uL (ref 0.1–1.0)
Monocytes Relative: 9.9 % (ref 3.0–12.0)
Neutro Abs: 2.3 10*3/uL (ref 1.4–7.7)
Neutrophils Relative %: 44.4 % (ref 43.0–77.0)
Platelets: 294 10*3/uL (ref 150.0–400.0)
RBC: 4.45 Mil/uL (ref 3.87–5.11)
RDW: 14.6 % (ref 11.5–15.5)
WBC: 5.2 10*3/uL (ref 4.0–10.5)

## 2018-12-09 LAB — LIPID PANEL
Cholesterol: 172 mg/dL (ref 0–200)
HDL: 69.4 mg/dL (ref >39.00)
LDL Cholesterol: 87 mg/dL (ref 0–99)
NonHDL: 102.45
Total CHOL/HDL Ratio: 2
Triglycerides: 78 mg/dL (ref 0.0–149.0)
VLDL: 15.6 mg/dL (ref 0.0–40.0)

## 2018-12-09 LAB — COMPREHENSIVE METABOLIC PANEL
ALT: 19 U/L (ref 0–35)
AST: 25 U/L (ref 0–37)
Albumin: 4.1 g/dL (ref 3.5–5.2)
Alkaline Phosphatase: 57 U/L (ref 39–117)
BUN: 8 mg/dL (ref 6–23)
CO2: 30 mEq/L (ref 19–32)
Calcium: 9.8 mg/dL (ref 8.4–10.5)
Chloride: 102 mEq/L (ref 96–112)
Creatinine, Ser: 0.69 mg/dL (ref 0.40–1.20)
GFR: 85.35 mL/min (ref >60.00)
Glucose, Bld: 91 mg/dL (ref 70–99)
Potassium: 4.2 mEq/L (ref 3.5–5.1)
Sodium: 138 mEq/L (ref 135–145)
Total Bilirubin: 0.5 mg/dL (ref 0.2–1.2)
Total Protein: 7.4 g/dL (ref 6.0–8.3)

## 2018-12-09 LAB — TSH: TSH: 1.11 u[IU]/mL (ref 0.35–4.50)

## 2018-12-09 NOTE — Patient Instructions (Addendum)
Labs today  prevnar vaccine today   Our office will call you about a colonoscopy  Call the breast center to set up mammogram / dexa   Call us when you want an audiology referral for hearing  Also if you want to see someone about the cyst on your finger  Work on your advance directive -the blue booklet and get it notarized

## 2018-12-09 NOTE — Progress Notes (Addendum)
Subjective:    Patient ID: Claudia Mcguire, female    DOB: Oct 08, 1955, 63 y.o.   MRN: [id number]  HPI Here for welcome to medicare visit   I have personally reviewed the Medicare Annual Wellness questionnaire and have noted 1. The patient's medical and social history 2. Their use of alcohol, tobacco or illicit drugs 3. Their current medications and supplements 4. The patient's functional ability including ADL's, fall risks, home safety risks and hearing or visual             impairment. 5. Diet and physical activities 6. Evidence for depression or mood disorders  The patients weight, height, BMI have been recorded in the chart and visual acuity is per eye clinic.  I have made referrals, counseling and provided education to the patient based review of the above and I have provided the pt with a written personalized care plan for preventive services. Reviewed and updated provider list, see scanned forms.  See scanned forms.  Routine anticipatory guidance given to patient.  See health maintenance. Colon cancer screening colonoscopy 1/06 -will schedule one  Breast cancer screening mammogram 1/13 -overdue for one  Self breast exam-no lumps  Flu vaccine-12/05/2018 at pharmacy  Tetanus vaccine  5/19 Tdap Pneumovax due for prevnar- will do today  Zoster vaccine-not covered well enough to afford /thinks she will pay out of pocket Dexa  5/08 osteopenia LFN  Falls-none  Fractures-none  Supplements-taking vitamin D daily  Exercise -walking sometimes (has had some R lateral leg pain)   Advance directive- has living will but not poa -given materials  Cognitive function addressed- see scanned forms- and if abnormal then additional documentation follows.  Taking some tumeric  No major concerns -has noticed some changes - tip of the tongue /remembers shortly   PMH and SH reviewed  Meds, vitals, and allergies reviewed.   ROS: See HPI.  Otherwise negative.    Weight : Wt Readings from  Last 3 Encounters:  12/09/18 219 lb 9 oz (99.6 kg)  08/04/17 216 lb 8 oz (98.2 kg)  08/01/13 218 lb 12 oz (99.2 kg)  weight went up during covid  Planning to walk more  Diet is pretty good overall -has to cook herself (more fish and more exercise)  Some sweets-tries not to buy  Not much bread  32.66 kg/m    Hearing/vision:  Hearing Screening   125Hz  250Hz  500Hz  1000Hz  2000Hz  3000Hz  4000Hz  6000Hz  8000Hz   Right ear:   0 40 40  0    Left ear:   0 0 40  0      Visual Acuity Screening   Right eye Left eye Both eyes  Without correction: 20/25 20/15 20/15   With correction:     she has noticed some hearing problems    Caring for 50 yo father  Lost her mother recently   Has a cyst on left 4th finger to watch   Patient Active Problem List   Diagnosis Date Noted  . Welcome to Medicare preventive visit 12/09/2018  . Screening mammogram, encounter for 12/09/2018  . Estrogen deficiency 12/09/2018  . Encounter for hepatitis C screening test for low risk patient 12/09/2018  . Colon cancer screening 12/09/2018  . Routine general medical examination at a health care facility 08/09/2017  . Osteopenia 08/04/2017  . Diabetes mellitus screening 08/04/2017  . Screening for lipoid disorders 08/04/2017  . Migraine with aura 12/01/2006   Past Medical History:  Diagnosis Date  . History of headache   .  History of migraine    No past surgical history on file. Social History   Tobacco Use  . Smoking status: Former Research scientist (life sciences)  . Smokeless tobacco: Never Used  . Tobacco comment: quit back in 1987  Substance Use Topics  . Alcohol use: No    Comment: socially  . Drug use: No   Family History  Problem Relation Age of Onset  . Hyperlipidemia Father   . Stroke Paternal Grandfather   . Diabetes Paternal Grandfather   . Stroke Paternal Uncle        2 uncles  . Dementia Mother    No Known Allergies Current Outpatient Medications on File Prior to Visit  Medication Sig Dispense Refill  .  rizatriptan (MAXALT) 10 MG tablet Take 1 tablet (10 mg total) by mouth as needed for migraine. May repeat in 2 hours if needed 10 tablet 11   No current facility-administered medications on file prior to visit.     Review of Systems  Constitutional: Negative for activity change, appetite change, fatigue, fever and unexpected weight change.  HENT: Negative for congestion, ear pain, rhinorrhea, sinus pressure and sore throat.   Eyes: Negative for pain, redness and visual disturbance.  Respiratory: Negative for cough, shortness of breath and wheezing.   Cardiovascular: Negative for chest pain and palpitations.  Gastrointestinal: Negative for abdominal pain, blood in stool, constipation and diarrhea.  Endocrine: Negative for polydipsia and polyuria.  Genitourinary: Negative for dysuria, frequency and urgency.  Musculoskeletal: Negative for arthralgias, back pain and myalgias.       Cyst on finger   Skin: Negative for pallor and rash.  Allergic/Immunologic: Negative for environmental allergies.  Neurological: Negative for dizziness, syncope and headaches.  Hematological: Negative for adenopathy. Does not bruise/bleed easily.  Psychiatric/Behavioral: Negative for decreased concentration and dysphoric mood. The patient is not nervous/anxious.        Stressors        Objective:   Physical Exam Constitutional:      General: She is not in acute distress.    Appearance: Normal appearance. She is well-developed. She is obese. She is not ill-appearing or diaphoretic.  HENT:     Head: Normocephalic and atraumatic.     Right Ear: Tympanic membrane, ear canal and external ear normal.     Left Ear: Tympanic membrane, ear canal and external ear normal.     Nose: Nose normal. No congestion.     Mouth/Throat:     Mouth: Mucous membranes are moist.     Pharynx: Oropharynx is clear. No posterior oropharyngeal erythema.  Eyes:     General: No scleral icterus.    Extraocular Movements: Extraocular  movements intact.     Conjunctiva/sclera: Conjunctivae normal.     Pupils: Pupils are equal, round, and reactive to light.  Neck:     Musculoskeletal: Normal range of motion and neck supple. No neck rigidity or muscular tenderness.     Thyroid: No thyromegaly.     Vascular: No carotid bruit or JVD.  Cardiovascular:     Rate and Rhythm: Normal rate and regular rhythm.     Pulses: Normal pulses.     Heart sounds: Normal heart sounds. No gallop.   Pulmonary:     Effort: Pulmonary effort is normal. No respiratory distress.     Breath sounds: Normal breath sounds. No wheezing.     Comments: Good air exch Chest:     Chest wall: No tenderness.  Abdominal:     General: Bowel sounds are  normal. There is no distension or abdominal bruit.     Palpations: Abdomen is soft. There is no mass.     Tenderness: There is no abdominal tenderness.     Hernia: No hernia is present.  Genitourinary:    Comments: Breast exam: No mass, nodules, thickening, tenderness, bulging, retraction, inflamation, nipple discharge or skin changes noted.  No axillary or clavicular LA.     Musculoskeletal: Normal range of motion.        General: No tenderness.     Right lower leg: No edema.     Left lower leg: No edema.     Comments: Cyst on L 4th finger-non tender and not red  Overlies the distal pip joint   Lymphadenopathy:     Cervical: No cervical adenopathy.  Skin:    General: Skin is warm and dry.     Coloration: Skin is not pale.     Findings: No erythema or rash.  Neurological:     Mental Status: She is alert. Mental status is at baseline.     Cranial Nerves: No cranial nerve deficit.     Motor: No abnormal muscle tone.     Coordination: Coordination normal.     Gait: Gait normal.     Deep Tendon Reflexes: Reflexes are normal and symmetric.  Psychiatric:        Mood and Affect: Mood normal.           Assessment & Plan:   Problem List Items Addressed This Visit      Musculoskeletal and  Integument   Osteopenia    dexa ordered No falls or fx Rev supplements and need for exercise         Other   Welcome to Medicare preventive visit - Primary    Reviewed health habits including diet and exercise and skin cancer prevention Reviewed appropriate screening tests for age  Also reviewed health mt list, fam hx and immunization status , as well as social and family history   Labs ordered Colonoscopy ref done  Mammogram and dexa ref done-pt will call breast center to schedule and # given No falls or fx  Enc exercise Advance directive not finished -given materials to work on  No cognitive concerns  Failed hearing screen-pt wil call when ready for audiology ref  Noted cyst on L 4th finger-offered ref when needed  Disc imp of self care        Relevant Orders   CBC with Differential/Platelet (Completed)   Comprehensive metabolic panel (Completed)   Lipid panel (Completed)   TSH (Completed)   Pneumococcal conjugate vaccine 13-valent (Completed)   Screening mammogram, encounter for    Ref done for mammogram Pt will call to schedule Nl breast exam here and self breast exam        Relevant Orders   MM 3D SCREEN BREAST BILATERAL   Estrogen deficiency   Relevant Orders   DG Bone Density   Encounter for hepatitis C screening test for low risk patient    Hep C screen today Low risk      Relevant Orders   Hepatitis C antibody   Colon cancer screening    Referral done for screening colonoscopy       Relevant Orders   Ambulatory referral to Gastroenterology    Other Visit Diagnoses    Need for vaccination with 13-polyvalent pneumococcal conjugate vaccine       Relevant Orders   Pneumococcal conjugate vaccine 13-valent (Completed)

## 2018-12-09 NOTE — Assessment & Plan Note (Signed)
dexa ordered No falls or fx Rev supplements and need for exercise

## 2018-12-09 NOTE — Assessment & Plan Note (Signed)
Ref done for mammogram Pt will call to schedule Nl breast exam here and self breast exam

## 2018-12-09 NOTE — Assessment & Plan Note (Signed)
Hep C screen today Low risk 

## 2018-12-09 NOTE — Assessment & Plan Note (Signed)
Referral done for screening colonoscopy  

## 2018-12-09 NOTE — Assessment & Plan Note (Signed)
Reviewed health habits including diet and exercise and skin cancer prevention Reviewed appropriate screening tests for age  Also reviewed health mt list, fam hx and immunization status , as well as social and family history   Labs ordered Colonoscopy ref done  Mammogram and dexa ref done-pt will call breast center to schedule and # given No falls or fx  Enc exercise Advance directive not finished -given materials to work on  No cognitive concerns  Failed hearing screen-pt wil call when ready for audiology ref  Noted cyst on L 4th finger-offered ref when needed  Disc imp of self care

## 2018-12-10 ENCOUNTER — Encounter: Payer: Self-pay | Admitting: *Deleted

## 2018-12-10 LAB — HEPATITIS C ANTIBODY
Hepatitis C Ab: NONREACTIVE
SIGNAL TO CUT-OFF: 0.01 (ref <1.00)

## 2018-12-11 ENCOUNTER — Encounter: Payer: Self-pay | Admitting: Family Medicine

## 2018-12-13 ENCOUNTER — Encounter: Payer: Self-pay | Admitting: *Deleted

## 2018-12-16 ENCOUNTER — Other Ambulatory Visit: Payer: Self-pay

## 2018-12-16 ENCOUNTER — Ambulatory Visit
Admission: RE | Admit: 2018-12-16 | Discharge: 2018-12-16 | Disposition: A | Discharge status: Home / Self Care | Payer: Medicare HMO | Source: Ambulatory Visit | Attending: Family Medicine | Admitting: Family Medicine

## 2018-12-16 DIAGNOSIS — M85852 Other specified disorders of bone density and structure, left thigh: Secondary | ICD-10-CM | POA: Diagnosis not present

## 2018-12-16 DIAGNOSIS — Z1231 Encounter for screening mammogram for malignant neoplasm of breast: Secondary | ICD-10-CM | POA: Diagnosis not present

## 2018-12-16 DIAGNOSIS — Z780 Asymptomatic menopausal state: Secondary | ICD-10-CM | POA: Diagnosis not present

## 2018-12-16 DIAGNOSIS — E2839 Other primary ovarian failure: Secondary | ICD-10-CM

## 2018-12-22 ENCOUNTER — Telehealth: Payer: Self-pay | Admitting: *Deleted

## 2018-12-22 NOTE — Telephone Encounter (Signed)
Left VM requesting pt to call the office back regarding DEXA scan 

## 2018-12-23 NOTE — Telephone Encounter (Signed)
Patient returned Shapale's call.  Patient can be reached at [phone].

## 2018-12-24 NOTE — Telephone Encounter (Signed)
Address through result notes  

## 2018-12-27 ENCOUNTER — Ambulatory Visit (INDEPENDENT_AMBULATORY_CARE_PROVIDER_SITE_OTHER): Payer: Medicare HMO | Admitting: Family Medicine

## 2018-12-27 ENCOUNTER — Encounter: Payer: Self-pay | Admitting: Family Medicine

## 2018-12-27 DIAGNOSIS — M85859 Other specified disorders of bone density and structure, unspecified thigh: Secondary | ICD-10-CM

## 2018-12-27 NOTE — Assessment & Plan Note (Signed)
Reviewed dexa with pt Lowest T score -1.6 in LFN  Disc risk factors for fracture and history/family history  Enc exercise Taking vit D  Getting ca from diet  Elected to hold off on tx and re check 2 y

## 2018-12-27 NOTE — Progress Notes (Signed)
Virtual Visit via Telephone Note  I connected with Erie Noe on 12/27/18 at  2:00 PM EDT by telephone and verified that I am speaking with the correct person using two identifiers.  Location: Patient: home Provider: office    I discussed the limitations, risks, security and privacy concerns of performing an evaluation and management service by telephone and the availability of in person appointments. I also discussed with the patient that there may be a patient responsible charge related to this service. The patient expressed understanding and agreed to proceed.   History of Present Illness: Here for f/u of dexa    FN left T score -1.6  No recent fx  No hx of post menopausal fx   ? OP in family  No fracture hx in family   Not on thyroid medicine or any chronic steroid tx   Takes 1000 iu vit D Not calcium   Exercise - walking now (had leg issues for a while)   Report:  PACS Intelerad Image Link  Show images for DG Bone Density  Study Result  EXAM: DUAL X-RAY ABSORPTIOMETRY (DXA) FOR BONE MINERAL DENSITY  IMPRESSION: Referring Physician:  Loura Pardon Your patient completed a BMD test using Lunar IDXA DXA system ( analysis version: 16 ) manufactured by [employer]. Technologist: CG PATIENT: Name: Claudia Mcguire, Claudia Mcguire Patient ID: [id number] Birth Date: 02-07-1956 Height: 69.3 in. Sex: Female Measured: 12/16/2018 Weight: 225.6 lbs. Indications: Caucasian, Estrogen Deficient, Postmenopausal Fractures: None Treatments: Vitamin D (E933.5)  ASSESSMENT: The BMD measured at Femur Neck Left is 0.809 g/cm2 with a T-score of -1.6. This patient is considered osteopenic according to [facility]) criteria. There has been a statistically significant decrease in BMD of Left hip since prior exam dated 08/06/2006. Prior DXA exam performed on Hologic device measures only unilateral hip (not Total Mean Hip). Therefore, current Total Mean Hip  cannot be compared to prior exam. Spine was not compared to prior study due to exclusion of vertebral bodies on current exam.  The scan quality is good. L-3 was excluded due to degenerative changes. Site Region Measured Date Measured Age YA BMD Significant CHANGE T-score DualFemur Neck Left 12/16/2018 65.1 -1.6 0.809 g/cm2 * DualFemur Neck Left  08/06/2006    52.7         -1.2    0.873 g/cm2  AP Spine  L1-L3      10/16/2018    65.1         0.9     1.278 g/cm2  DualFemur Total Mean 12/16/2018    65.1         -0.6    0.926 g/cm2  World Health Organization [facility]) criteria for post-menopausal, Caucasian Women: Normal       T-score at or above -1 SD Osteopenia   T-score between -1 and -2.5 SD Osteoporosis T-score at or below -2.5 SD  RECOMMENDATION: 1. All patients should optimize calcium and vitamin D intake. 2. Consider FDA approved medical therapies in postmenopausal women and men aged 8 years and older, based on the following: a. A hip or vertebral (clinical or morphometric) fracture b. T- score < or = -2.5 at the femoral neck or spine after appropriate evaluation to exclude secondary causes c. Low bone mass (T-score between -1.0 and -2.5 at the femoral neck or spine) and a 10 year probability of a hip fracture > or = 3% or a 10 year probability of a major osteoporosis-related fracture > or = 20% based on the  [place]-adapted WHO algorithm d. Clinician judgment and/or patient preferences may indicate treatment for people with 10-year fracture probabilities above or below these levels  FOLLOW-UP: Patients with diagnosis of osteoporosis or at high risk for fracture should have regular bone mineral density tests. For patients eligible for Medicare, routine testing is allowed once every 2 years. The testing frequency can be increased to one year for patients who have rapidly progressing disease, those who are receiving or discontinuing medical therapy to restore bone mass,  or have additional risk factors.  [facility]  FRAX* 10-year Probability of Fracture Based on femoral neck BMD: DualFemur (Left)  Major Osteoporotic Fracture: 8.9% Hip Fracture:                1.0% Population:                  [place] (Caucasian) Risk Factors:                None      Patient Active Problem List   Diagnosis Date Noted  . Welcome to Medicare preventive visit 12/09/2018  . Screening mammogram, encounter for 12/09/2018  . Estrogen deficiency 12/09/2018  . Encounter for hepatitis C screening test for low risk patient 12/09/2018  . Colon cancer screening 12/09/2018  . Routine general medical examination at a health care facility 08/09/2017  . Osteopenia 08/04/2017  . Diabetes mellitus screening 08/04/2017  . Screening for lipoid disorders 08/04/2017  . Migraine with aura 12/01/2006   Past Medical History:  Diagnosis Date  . History of headache   . History of migraine    No past surgical history on file. Social History   Tobacco Use  . Smoking status: Former Games developermoker  . Smokeless tobacco: Never Used  . Tobacco comment: quit back in 1987  Substance Use Topics  . Alcohol use: No    Comment: socially  . Drug use: No   Family History  Problem Relation Age of Onset  . Hyperlipidemia Father   . Stroke Paternal Grandfather   . Diabetes Paternal Grandfather   . Stroke Paternal Uncle        2 uncles  . Dementia Mother    No Known Allergies Current Outpatient Medications on File Prior to Visit  Medication Sig Dispense Refill  . rizatriptan (MAXALT) 10 MG tablet Take 1 tablet (10 mg total) by mouth as needed for migraine. May repeat in 2 hours if needed 10 tablet 11   No current facility-administered medications on file prior to visit.    Review of Systems  Constitutional: Negative for chills, fever, malaise/fatigue and weight loss.  Eyes: Negative for blurred vision.  Respiratory: Negative for cough and shortness of breath.   Cardiovascular:  Negative for chest pain and palpitations.  Gastrointestinal: Negative for heartburn and nausea.  Musculoskeletal: Positive for back pain. Negative for falls.  Skin: Negative for rash.  Neurological: Negative for dizziness and tremors.       No falls    Observations/Objective: Pt sounds like her usual self  Cheerful  Nl cognition  Nl affect  Voiced understanding of material presented and choices  Nl voice-not hoarse No cough or sob during interview    Assessment and Plan: Problem List Items Addressed This Visit      Musculoskeletal and Integument   Osteopenia - Primary    Reviewed dexa with pt Lowest T score -1.6 in LFN  Disc risk factors for fracture and history/family history  Enc exercise Taking vit D  Getting  ca from diet  Elected to hold off on tx and re check 2 y           Follow Up Instructions: Continue vitamin D daily and calcium rich foods Continue walking and /or doing resistance training Be careful not to fall-your fracture risk is elevated We will re check dexa in 2 years    I discussed the assessment and treatment plan with the patient. The patient was provided an opportunity to ask questions and all were answered. The patient agreed with the plan and demonstrated an understanding of the instructions.   The patient was advised to call back or seek an in-person evaluation if the symptoms worsen or if the condition fails to improve as anticipated.  I provided 16 minutes of non-face-to-face time during this encounter.   Roxy Manns, MD

## 2018-12-27 NOTE — Patient Instructions (Addendum)
Continue vitamin D daily and calcium rich foods Continue walking and /or doing resistance training Be careful not to fall-your fracture risk is elevated We will re check dexa in 2 years

## 2019-01-17 DIAGNOSIS — L600 Ingrowing nail: Secondary | ICD-10-CM | POA: Diagnosis not present

## 2019-01-17 DIAGNOSIS — M79675 Pain in left toe(s): Secondary | ICD-10-CM | POA: Diagnosis not present

## 2019-01-20 ENCOUNTER — Other Ambulatory Visit: Payer: Self-pay | Admitting: Family Medicine

## 2019-01-20 NOTE — Telephone Encounter (Signed)
Last OV was on 12/27/18 to discuss DEXA results. Last filled on 08/04/17 #10 tabs with 11 refills

## 2019-03-28 DIAGNOSIS — Z20828 Contact with and (suspected) exposure to other viral communicable diseases: Secondary | ICD-10-CM | POA: Diagnosis not present

## 2019-06-06 DIAGNOSIS — R69 Illness, unspecified: Secondary | ICD-10-CM | POA: Diagnosis not present

## 2019-06-07 DIAGNOSIS — R69 Illness, unspecified: Secondary | ICD-10-CM | POA: Diagnosis not present

## 2019-06-24 DIAGNOSIS — M79661 Pain in right lower leg: Secondary | ICD-10-CM | POA: Diagnosis not present

## 2019-06-24 DIAGNOSIS — M7121 Synovial cyst of popliteal space [Baker], right knee: Secondary | ICD-10-CM | POA: Diagnosis not present

## 2019-06-24 DIAGNOSIS — Z832 Family history of diseases of the blood and blood-forming organs and certain disorders involving the immune mechanism: Secondary | ICD-10-CM | POA: Diagnosis not present

## 2019-06-24 DIAGNOSIS — Z87891 Personal history of nicotine dependence: Secondary | ICD-10-CM | POA: Diagnosis not present

## 2019-06-24 DIAGNOSIS — M79604 Pain in right leg: Secondary | ICD-10-CM | POA: Diagnosis not present

## 2019-08-02 DIAGNOSIS — M545 Low back pain: Secondary | ICD-10-CM | POA: Diagnosis not present

## 2019-08-02 DIAGNOSIS — M1611 Unilateral primary osteoarthritis, right hip: Secondary | ICD-10-CM | POA: Diagnosis not present

## 2019-12-07 ENCOUNTER — Telehealth: Payer: Self-pay | Admitting: Family Medicine

## 2019-12-07 DIAGNOSIS — Z1211 Encounter for screening for malignant neoplasm of colon: Secondary | ICD-10-CM

## 2019-12-07 NOTE — Telephone Encounter (Signed)
Pt called stating  dr tower said it was time for colonoscopy.  She would like go to   [facility] [address] [place]  Phone [phone]   Pt has medicare and when she called they will take her insurance but needs referral

## 2019-12-07 NOTE — Telephone Encounter (Signed)
Referral done  Will send to [facility] 

## 2019-12-10 DIAGNOSIS — R69 Illness, unspecified: Secondary | ICD-10-CM | POA: Diagnosis not present

## 2019-12-19 DIAGNOSIS — I10 Essential (primary) hypertension: Secondary | ICD-10-CM | POA: Diagnosis not present

## 2019-12-19 DIAGNOSIS — Z1211 Encounter for screening for malignant neoplasm of colon: Secondary | ICD-10-CM | POA: Diagnosis not present

## 2019-12-30 DIAGNOSIS — M170 Bilateral primary osteoarthritis of knee: Secondary | ICD-10-CM | POA: Diagnosis not present

## 2019-12-30 DIAGNOSIS — M25561 Pain in right knee: Secondary | ICD-10-CM | POA: Diagnosis not present

## 2019-12-30 DIAGNOSIS — M25559 Pain in unspecified hip: Secondary | ICD-10-CM | POA: Diagnosis not present

## 2019-12-30 DIAGNOSIS — M25562 Pain in left knee: Secondary | ICD-10-CM | POA: Diagnosis not present

## 2019-12-30 DIAGNOSIS — M25551 Pain in right hip: Secondary | ICD-10-CM | POA: Diagnosis not present

## 2020-01-10 ENCOUNTER — Encounter: Payer: Self-pay | Admitting: Family Medicine

## 2020-01-10 DIAGNOSIS — K529 Noninfective gastroenteritis and colitis, unspecified: Secondary | ICD-10-CM | POA: Diagnosis not present

## 2020-01-10 DIAGNOSIS — K5730 Diverticulosis of large intestine without perforation or abscess without bleeding: Secondary | ICD-10-CM | POA: Diagnosis not present

## 2020-01-10 DIAGNOSIS — Z1211 Encounter for screening for malignant neoplasm of colon: Secondary | ICD-10-CM | POA: Diagnosis not present

## 2020-01-25 DIAGNOSIS — M25551 Pain in right hip: Secondary | ICD-10-CM | POA: Diagnosis not present

## 2020-01-25 DIAGNOSIS — M1611 Unilateral primary osteoarthritis, right hip: Secondary | ICD-10-CM | POA: Diagnosis not present

## 2020-01-30 ENCOUNTER — Telehealth: Payer: Self-pay | Admitting: Family Medicine

## 2020-01-30 DIAGNOSIS — Z0000 Encounter for general adult medical examination without abnormal findings: Secondary | ICD-10-CM

## 2020-01-30 NOTE — Telephone Encounter (Signed)
-----   Message from Aquilla Solian, RT sent at 01/19/2020  1:21 PM EDT ----- Regarding: Lab Orders for Tuesday 11.2.2021 Please place lab orders for Tuesday 11.2.2021, office visit for physical on Monday 11.8.2021 Thank you, Jones Bales RT(R)

## 2020-01-31 ENCOUNTER — Other Ambulatory Visit (INDEPENDENT_AMBULATORY_CARE_PROVIDER_SITE_OTHER): Payer: Medicare HMO

## 2020-01-31 ENCOUNTER — Other Ambulatory Visit: Payer: Self-pay

## 2020-01-31 ENCOUNTER — Ambulatory Visit: Payer: Medicare HMO

## 2020-01-31 ENCOUNTER — Telehealth: Payer: Self-pay

## 2020-01-31 DIAGNOSIS — Z0000 Encounter for general adult medical examination without abnormal findings: Secondary | ICD-10-CM | POA: Diagnosis not present

## 2020-01-31 NOTE — Telephone Encounter (Signed)
Called patient 3 times trying to complete her AWV. Patient never answered. Left message notifying patient that appointment was cancelled and she can call to reschedule or provider will probably complete this at her upcoming physical.

## 2020-01-31 NOTE — Addendum Note (Signed)
Addended by: Alvina Chou on: 01/31/2020 04:31 PM   Modules accepted: Orders

## 2020-02-01 ENCOUNTER — Other Ambulatory Visit (INDEPENDENT_AMBULATORY_CARE_PROVIDER_SITE_OTHER): Payer: Medicare HMO

## 2020-02-01 DIAGNOSIS — Z0000 Encounter for general adult medical examination without abnormal findings: Secondary | ICD-10-CM | POA: Diagnosis not present

## 2020-02-01 LAB — CBC WITH DIFFERENTIAL/PLATELET
Basophils Absolute: 0.1 10*3/uL (ref 0.0–0.1)
Basophils Relative: 1.8 % (ref 0.0–3.0)
Eosinophils Absolute: 0.4 10*3/uL (ref 0.0–0.7)
Eosinophils Relative: 8.8 % — ABNORMAL HIGH (ref 0.0–5.0)
HCT: 39.9 % (ref 36.0–46.0)
Hemoglobin: 13.4 g/dL (ref 12.0–15.0)
Lymphocytes Relative: 22.6 % (ref 12.0–46.0)
Lymphs Abs: 1 10*3/uL (ref 0.7–4.0)
MCHC: 33.7 g/dL (ref 30.0–36.0)
MCV: 94.4 fl (ref 78.0–100.0)
Monocytes Absolute: 0.6 10*3/uL (ref 0.1–1.0)
Monocytes Relative: 12.2 % — ABNORMAL HIGH (ref 3.0–12.0)
Neutro Abs: 2.5 10*3/uL (ref 1.4–7.7)
Neutrophils Relative %: 54.6 % (ref 43.0–77.0)
Platelets: 264 10*3/uL (ref 150.0–400.0)
RBC: 4.23 Mil/uL (ref 3.87–5.11)
RDW: 13.7 % (ref 11.5–15.5)
WBC: 4.5 10*3/uL (ref 4.0–10.5)

## 2020-02-01 LAB — COMPREHENSIVE METABOLIC PANEL
ALT: 52 U/L — ABNORMAL HIGH (ref 0–35)
AST: 49 U/L — ABNORMAL HIGH (ref 0–37)
Albumin: 3.9 g/dL (ref 3.5–5.2)
Alkaline Phosphatase: 55 U/L (ref 39–117)
BUN: 10 mg/dL (ref 6–23)
CO2: 31 mEq/L (ref 19–32)
Calcium: 9 mg/dL (ref 8.4–10.5)
Chloride: 101 mEq/L (ref 96–112)
Creatinine, Ser: 0.69 mg/dL (ref 0.40–1.20)
GFR: 90.53 mL/min (ref >60.00)
Glucose, Bld: 76 mg/dL (ref 70–99)
Potassium: 4.4 mEq/L (ref 3.5–5.1)
Sodium: 136 mEq/L (ref 135–145)
Total Bilirubin: 0.6 mg/dL (ref 0.2–1.2)
Total Protein: 6.4 g/dL (ref 6.0–8.3)

## 2020-02-01 LAB — LIPID PANEL
Cholesterol: 173 mg/dL (ref 0–200)
HDL: 90.6 mg/dL (ref >39.00)
LDL Cholesterol: 73 mg/dL (ref 0–99)
NonHDL: 82.32
Total CHOL/HDL Ratio: 2
Triglycerides: 47 mg/dL (ref 0.0–149.0)
VLDL: 9.4 mg/dL (ref 0.0–40.0)

## 2020-02-01 LAB — TSH: TSH: 1.51 u[IU]/mL (ref 0.35–4.50)

## 2020-02-06 ENCOUNTER — Other Ambulatory Visit: Payer: Self-pay

## 2020-02-06 ENCOUNTER — Ambulatory Visit (INDEPENDENT_AMBULATORY_CARE_PROVIDER_SITE_OTHER): Payer: Medicare HMO | Admitting: Family Medicine

## 2020-02-06 ENCOUNTER — Encounter: Payer: Self-pay | Admitting: Family Medicine

## 2020-02-06 VITALS — BP 108/70 | HR 91 | Temp 97.6°F | Ht 69.0 in | Wt 219.7 lb

## 2020-02-06 DIAGNOSIS — Z1211 Encounter for screening for malignant neoplasm of colon: Secondary | ICD-10-CM | POA: Diagnosis not present

## 2020-02-06 DIAGNOSIS — M1612 Unilateral primary osteoarthritis, left hip: Secondary | ICD-10-CM | POA: Diagnosis not present

## 2020-02-06 DIAGNOSIS — Z23 Encounter for immunization: Secondary | ICD-10-CM | POA: Diagnosis not present

## 2020-02-06 DIAGNOSIS — Z0000 Encounter for general adult medical examination without abnormal findings: Secondary | ICD-10-CM

## 2020-02-06 DIAGNOSIS — M85859 Other specified disorders of bone density and structure, unspecified thigh: Secondary | ICD-10-CM

## 2020-02-06 DIAGNOSIS — M169 Osteoarthritis of hip, unspecified: Secondary | ICD-10-CM | POA: Insufficient documentation

## 2020-02-06 MED ORDER — RIZATRIPTAN BENZOATE 10 MG PO TABS: ORAL_TABLET | ORAL | 11 refills | Status: DC

## 2020-02-06 MED ORDER — DICLOFENAC SODIUM 75 MG PO TBEC: 75.0000 mg | DELAYED_RELEASE_TABLET | Freq: Two times a day (BID) | ORAL | 11 refills | Status: DC | PRN

## 2020-02-06 NOTE — Assessment & Plan Note (Signed)
dexa 9/20   Due in another year One trip and fall -disc fall prev No fractures Taking D3

## 2020-02-06 NOTE — Assessment & Plan Note (Signed)
Pt has had cortisone inj at [facility] which helped greatly  Will eventually need hip repl Refilled diclofenac to take with food/caution  Encouraged low impact exercise that does not hurt

## 2020-02-06 NOTE — Patient Instructions (Addendum)
Pneumovax 23 today   Take care of yourself  Stay as active as you can be

## 2020-02-06 NOTE — Progress Notes (Signed)
Subjective:    Patient ID: Claudia Mcguire, female    DOB: 12/10/1955, 64 y.o.   MRN: [id number]  This visit occurred during the SARS-CoV-2 public health emergency.  Safety protocols were in place, including screening questions prior to the visit, additional usage of staff PPE, and extensive cleaning of exam room while observing appropriate contact time as indicated for disinfecting solutions.    HPI Here for health maintenance exam and to review chronic medical problems    Wt Readings from Last 3 Encounters:  02/06/20 219 lb 11.2 oz (99.7 kg)  12/09/18 219 lb 9 oz (99.6 kg)  08/04/17 216 lb 8 oz (98.2 kg)   32.44 kg/m   Having trouble with her R hip - went to orthopedics (first hip and then knees)  Some joint changes  Given meloxicam -did not help  Went to [facility] place -given cortisone in knee and hip joint  Going back in January- for another treatment  May consider lubricant injection   Will eventually need hip replacement   Makes it hard to exercise and that frustrates (she used to be an avid walker)  Considered water exercise  She has a pool to move in  Does as much as she can at home- housework and yard work   Lost her dad early this year - a lot of moving between [place] and here  Rough start to the year     Flu shot -had 12/10/19 pna vaccine -due for pna 23 vaccine   Mammogram 9/20-did not have one this year -will plan it Self breast exam - no lumps   Gyn-had a visit in February , no problems   Tdap 5/19  covid status -immunized pfizer (booster planned for December)  Zoster status -immunized with first shingrix (2nd one in [place])   9/20 dexa showed osteopenia Falls - once, taking out the trash down a hill/ fell on back side  Fractures -none Supplements - D3  Exercise - less with hip problems    Colonoscopy 10/21   BP Readings from Last 3 Encounters:  02/06/20 108/70  12/09/18 126/86  08/04/17 116/80   Pulse Readings from Last 3 Encounters:    02/06/20 91  12/09/18 77  08/04/17 67   Due for wellness labs   Labs: Cholesterol Lab Results  Component Value Date   CHOL 173 02/01/2020   CHOL 172 12/09/2018   CHOL 158 08/04/2017   Lab Results  Component Value Date   HDL 90.60 02/01/2020   HDL 69.40 12/09/2018   HDL 67.80 08/04/2017   Lab Results  Component Value Date   LDLCALC 73 02/01/2020   LDLCALC 87 12/09/2018   LDLCALC 73 08/04/2017   Lab Results  Component Value Date   TRIG 47.0 02/01/2020   TRIG 78.0 12/09/2018   TRIG 83.0 08/04/2017   Lab Results  Component Value Date   CHOLHDL 2 02/01/2020   CHOLHDL 2 12/09/2018   CHOLHDL 2 08/04/2017   No results found for: LDLDIRECT Very good ratio  Other labs Results for orders placed or performed in visit on 02/01/20  TSH  Result Value Ref Range   TSH 1.51 0.35 - 4.50 uIU/mL  Comprehensive metabolic panel  Result Value Ref Range   Sodium 136 135 - 145 mEq/L   Potassium 4.4 3.5 - 5.1 mEq/L   Chloride 101 96 - 112 mEq/L   CO2 31 19 - 32 mEq/L   Glucose, Bld 76 70 - 99 mg/dL   BUN 10 6 -  23 mg/dL   Creatinine, Ser 8.84 0.40 - 1.20 mg/dL   Total Bilirubin 0.6 0.2 - 1.2 mg/dL   Alkaline Phosphatase 55 39 - 117 U/L   AST 49 (H) 0 - 37 U/L   ALT 52 (H) 0 - 35 U/L   Total Protein 6.4 6.0 - 8.3 g/dL   Albumin 3.9 3.5 - 5.2 g/dL   GFR 16.60 >63.01 mL/min   Calcium 9.0 8.4 - 10.5 mg/dL  CBC with Differential/Platelet  Result Value Ref Range   WBC 4.5 4.0 - 10.5 K/uL   RBC 4.23 3.87 - 5.11 Mil/uL   Hemoglobin 13.4 12.0 - 15.0 g/dL   HCT 60.1 36 - 46 %   MCV 94.4 78.0 - 100.0 fl   MCHC 33.7 30.0 - 36.0 g/dL   RDW 09.3 23.5 - 57.3 %   Platelets 264.0 150 - 400 K/uL   Neutrophils Relative % 54.6 43 - 77 %   Lymphocytes Relative 22.6 12 - 46 %   Monocytes Relative 12.2 (H) 3 - 12 %   Eosinophils Relative 8.8 (H) 0 - 5 %   Basophils Relative 1.8 0 - 3 %   Neutro Abs 2.5 1.4 - 7.7 K/uL   Lymphs Abs 1.0 0.7 - 4.0 K/uL   Monocytes Absolute 0.6 0.1 - 1.0  K/uL   Eosinophils Absolute 0.4 0.0 - 0.7 K/uL   Basophils Absolute 0.1 0.0 - 0.1 K/uL  Lipid panel  Result Value Ref Range   Cholesterol 173 0 - 200 mg/dL   Triglycerides 22.0 0 - 149 mg/dL   HDL 25.42 >70.62 mg/dL   VLDL 9.4 0.0 - 37.6 mg/dL   LDL Cholesterol 73 0 - 99 mg/dL   Total CHOL/HDL Ratio 2    NonHDL 82.32     Pt has taken tylenol for her hip pain-may be reason for small bump in transaminases  Pt needs refill of diclofenac for hip pai    Patient Active Problem List   Diagnosis Date Noted  . OA (osteoarthritis) of hip 02/06/2020  . Welcome to Medicare preventive visit 12/09/2018  . Screening mammogram, encounter for 12/09/2018  . Estrogen deficiency 12/09/2018  . Encounter for hepatitis C screening test for low risk patient 12/09/2018  . Colon cancer screening 12/09/2018  . Routine general medical examination at a health care facility 08/09/2017  . Osteopenia 08/04/2017  . Diabetes mellitus screening 08/04/2017  . Screening for lipoid disorders 08/04/2017  . Migraine with aura 12/01/2006   Past Medical History:  Diagnosis Date  . History of headache   . History of migraine    No past surgical history on file. Social History   Tobacco Use  . Smoking status: Former Games developer  . Smokeless tobacco: Never Used  . Tobacco comment: quit back in 1987  Vaping Use  . Vaping Use: Never used  Substance Use Topics  . Alcohol use: Yes    Alcohol/week: 3.0 - 4.0 standard drinks    Types: 3 - 4 Cans of beer per week    Comment: daily  . Drug use: No   Family History  Problem Relation Age of Onset  . Hyperlipidemia Father   . Stroke Paternal Grandfather   . Diabetes Paternal Grandfather   . Stroke Paternal Uncle        2 uncles  . Dementia Mother    No Known Allergies Current Outpatient Medications on File Prior to Visit  Medication Sig Dispense Refill  . Ascorbic Acid (VITAMIN C) 1000 MG tablet  Take 1,000 mg by mouth daily.    . Cholecalciferol (VITAMIN D3)  125 MCG (5000 UT) CAPS Take by mouth.    . diclofenac Sodium (VOLTAREN) 1 % GEL Apply topically 4 (four) times daily.    . Flaxseed, Linseed, (FLAX SEED OIL) 1000 MG CAPS Take by mouth.    . Multiple Vitamin (MULTIVITAMIN ADULT PO) Take by mouth.    . vitamin B-12 (CYANOCOBALAMIN) 1000 MCG tablet Take 1,000 mcg by mouth daily.     No current facility-administered medications on file prior to visit.    Review of Systems  Constitutional: Negative for activity change, appetite change, fatigue, fever and unexpected weight change.  HENT: Negative for congestion, ear pain, rhinorrhea, sinus pressure and sore throat.   Eyes: Negative for pain, redness and visual disturbance.  Respiratory: Negative for cough, shortness of breath and wheezing.   Cardiovascular: Negative for chest pain and palpitations.  Gastrointestinal: Negative for abdominal pain, blood in stool, constipation and diarrhea.  Endocrine: Negative for polydipsia and polyuria.  Genitourinary: Negative for dysuria, frequency and urgency.  Musculoskeletal: Positive for arthralgias. Negative for back pain and myalgias.  Skin: Negative for pallor and rash.  Allergic/Immunologic: Negative for environmental allergies.  Neurological: Negative for dizziness, syncope and headaches.  Hematological: Negative for adenopathy. Does not bruise/bleed easily.  Psychiatric/Behavioral: Negative for decreased concentration and dysphoric mood. The patient is not nervous/anxious.        Stressors are starting to improve grief       Objective:   Physical Exam Constitutional:      General: She is not in acute distress.    Appearance: Normal appearance. She is well-developed. She is obese. She is not ill-appearing or diaphoretic.  HENT:     Head: Normocephalic and atraumatic.     Right Ear: Tympanic membrane, ear canal and external ear normal.     Left Ear: Tympanic membrane, ear canal and external ear normal.     Nose: Nose normal. No congestion.       Mouth/Throat:     Mouth: Mucous membranes are moist.     Pharynx: Oropharynx is clear. No posterior oropharyngeal erythema.  Eyes:     General: No scleral icterus.    Extraocular Movements: Extraocular movements intact.     Conjunctiva/sclera: Conjunctivae normal.     Pupils: Pupils are equal, round, and reactive to light.  Neck:     Thyroid: No thyromegaly.     Vascular: No carotid bruit or JVD.  Cardiovascular:     Rate and Rhythm: Normal rate and regular rhythm.     Pulses: Normal pulses.     Heart sounds: Normal heart sounds. No gallop.   Pulmonary:     Effort: Pulmonary effort is normal. No respiratory distress.     Breath sounds: Normal breath sounds. No wheezing.     Comments: Good air exch Chest:     Chest wall: No tenderness.  Abdominal:     General: Bowel sounds are normal. There is no distension or abdominal bruit.     Palpations: Abdomen is soft. There is no mass.     Tenderness: There is no abdominal tenderness.     Hernia: No hernia is present.  Genitourinary:    Comments: Breast and pelvic exam done by gyn Musculoskeletal:        General: No tenderness. Normal range of motion.     Cervical back: Normal range of motion and neck supple. No rigidity. No muscular tenderness.     Right  lower leg: No edema.     Left lower leg: No edema.     Comments: No kyphosis   Lymphadenopathy:     Cervical: No cervical adenopathy.  Skin:    General: Skin is warm and dry.     Coloration: Skin is not pale.     Findings: No erythema or rash.     Comments: Solar lentigines diffusely   Neurological:     Mental Status: She is alert. Mental status is at baseline.     Cranial Nerves: No cranial nerve deficit.     Motor: No abnormal muscle tone.     Coordination: Coordination normal.     Gait: Gait normal.     Deep Tendon Reflexes: Reflexes are normal and symmetric.  Psychiatric:        Mood and Affect: Mood normal.        Cognition and Memory: Cognition and memory normal.            Assessment & Plan:   Problem List Items Addressed This Visit      Musculoskeletal and Integument   Osteopenia    dexa 9/20   Due in another year One trip and fall -disc fall prev No fractures Taking D3       OA (osteoarthritis) of hip    Pt has had cortisone inj at [facility] which helped greatly  Will eventually need hip repl Refilled diclofenac to take with food/caution  Encouraged low impact exercise that does not hurt      Relevant Medications   diclofenac (VOLTAREN) 75 MG EC tablet     Other   Routine general medical examination at a health care facility - Primary    Reviewed health habits including diet and exercise and skin cancer prevention Reviewed appropriate screening tests for age  Also reviewed health mt list, fam hx and immunization status , as well as social and family history   See HPI Labs reviewed  Disc water exercise in light of hip problems  Disc grief after loosing father  pna 23 vaccine given today  She plans to get a covid booster in dec  2nd shingrix vaccine is due in January  Dext up to date  Colonoscopy up to date  Pt has seen gyn for exam Given phone number to schedule her mammogram       Colon cancer screening    Recent colonoscopy- oct  Is up to date       Other Visit Diagnoses    Need for pneumococcal vaccination       Relevant Orders   Pneumococcal polysaccharide vaccine 23-valent greater than or equal to 2yo subcutaneous/IM (Completed)

## 2020-02-06 NOTE — Assessment & Plan Note (Signed)
Recent colonoscopy- oct  Is up to date

## 2020-02-06 NOTE — Assessment & Plan Note (Addendum)
Reviewed health habits including diet and exercise and skin cancer prevention Reviewed appropriate screening tests for age  Also reviewed health mt list, fam hx and immunization status , as well as social and family history   See HPI Labs reviewed  Disc water exercise in light of hip problems  Disc grief after loosing father  pna 23 vaccine given today  She plans to get a covid booster in dec  2nd shingrix vaccine is due in January  Dext up to date  Colonoscopy up to date  Pt has seen gyn for exam Given phone number to schedule her mammogram

## 2020-02-15 ENCOUNTER — Telehealth: Payer: Self-pay | Admitting: *Deleted

## 2020-02-15 NOTE — Telephone Encounter (Signed)
Patient called stating that she has been taking Diclofenac for arthritis pain. Patient stated that the medication is tearing her stomach up and she does not want to take it anymore. Patient stated that the pill causes stomach pain, a lot of gas and her bowel movements smell really bad. Patient stated that she has used the Diclofenac gel but it does not help as much as the pill does. Patient wants to know if there is something other than the Diclofenac she can take for her arthritis pain. Pharmacy [facility]

## 2020-02-15 NOTE — Telephone Encounter (Signed)
Tylenol is ok  (not as effective but in combination with the gel may help more)

## 2020-02-16 NOTE — Telephone Encounter (Signed)
Pt notified of Dr. Royden Purl comments and recommendations. voltaren tabs removed from med list

## 2020-02-27 DIAGNOSIS — Z23 Encounter for immunization: Secondary | ICD-10-CM | POA: Diagnosis not present

## 2020-03-07 DIAGNOSIS — R69 Illness, unspecified: Secondary | ICD-10-CM | POA: Diagnosis not present

## 2020-04-12 DIAGNOSIS — M25562 Pain in left knee: Secondary | ICD-10-CM | POA: Diagnosis not present

## 2020-04-12 DIAGNOSIS — M25561 Pain in right knee: Secondary | ICD-10-CM | POA: Diagnosis not present

## 2020-04-12 DIAGNOSIS — M25551 Pain in right hip: Secondary | ICD-10-CM | POA: Diagnosis not present

## 2020-04-12 DIAGNOSIS — M170 Bilateral primary osteoarthritis of knee: Secondary | ICD-10-CM | POA: Diagnosis not present

## 2020-04-25 DIAGNOSIS — M25551 Pain in right hip: Secondary | ICD-10-CM | POA: Diagnosis not present

## 2020-04-25 DIAGNOSIS — M1712 Unilateral primary osteoarthritis, left knee: Secondary | ICD-10-CM | POA: Diagnosis not present

## 2020-04-25 DIAGNOSIS — M1611 Unilateral primary osteoarthritis, right hip: Secondary | ICD-10-CM | POA: Diagnosis not present

## 2020-04-25 DIAGNOSIS — M25562 Pain in left knee: Secondary | ICD-10-CM | POA: Diagnosis not present

## 2020-04-27 DIAGNOSIS — M25561 Pain in right knee: Secondary | ICD-10-CM | POA: Diagnosis not present

## 2020-04-27 DIAGNOSIS — M1711 Unilateral primary osteoarthritis, right knee: Secondary | ICD-10-CM | POA: Diagnosis not present

## 2020-04-29 DIAGNOSIS — Z20822 Contact with and (suspected) exposure to covid-19: Secondary | ICD-10-CM | POA: Diagnosis not present

## 2020-04-30 DIAGNOSIS — Z20822 Contact with and (suspected) exposure to covid-19: Secondary | ICD-10-CM | POA: Diagnosis not present

## 2020-05-10 DIAGNOSIS — M1712 Unilateral primary osteoarthritis, left knee: Secondary | ICD-10-CM | POA: Diagnosis not present

## 2020-05-10 DIAGNOSIS — M25562 Pain in left knee: Secondary | ICD-10-CM | POA: Diagnosis not present

## 2020-05-11 DIAGNOSIS — Z01419 Encounter for gynecological examination (general) (routine) without abnormal findings: Secondary | ICD-10-CM | POA: Diagnosis not present

## 2020-05-14 DIAGNOSIS — Z124 Encounter for screening for malignant neoplasm of cervix: Secondary | ICD-10-CM | POA: Diagnosis not present

## 2020-05-16 DIAGNOSIS — M1711 Unilateral primary osteoarthritis, right knee: Secondary | ICD-10-CM | POA: Diagnosis not present

## 2020-05-16 DIAGNOSIS — M25561 Pain in right knee: Secondary | ICD-10-CM | POA: Diagnosis not present

## 2020-05-16 LAB — HM PAP SMEAR

## 2020-05-17 DIAGNOSIS — M25562 Pain in left knee: Secondary | ICD-10-CM | POA: Diagnosis not present

## 2020-05-17 DIAGNOSIS — M1712 Unilateral primary osteoarthritis, left knee: Secondary | ICD-10-CM | POA: Diagnosis not present

## 2020-05-22 DIAGNOSIS — R791 Abnormal coagulation profile: Secondary | ICD-10-CM | POA: Diagnosis not present

## 2020-05-22 DIAGNOSIS — M1611 Unilateral primary osteoarthritis, right hip: Secondary | ICD-10-CM | POA: Diagnosis not present

## 2020-05-22 DIAGNOSIS — Z01812 Encounter for preprocedural laboratory examination: Secondary | ICD-10-CM | POA: Diagnosis not present

## 2020-05-22 DIAGNOSIS — M25551 Pain in right hip: Secondary | ICD-10-CM | POA: Diagnosis not present

## 2020-05-23 DIAGNOSIS — M25561 Pain in right knee: Secondary | ICD-10-CM | POA: Diagnosis not present

## 2020-05-23 DIAGNOSIS — M1711 Unilateral primary osteoarthritis, right knee: Secondary | ICD-10-CM | POA: Diagnosis not present

## 2020-05-24 DIAGNOSIS — M25562 Pain in left knee: Secondary | ICD-10-CM | POA: Diagnosis not present

## 2020-05-24 DIAGNOSIS — M1712 Unilateral primary osteoarthritis, left knee: Secondary | ICD-10-CM | POA: Diagnosis not present

## 2020-05-25 DIAGNOSIS — Z1231 Encounter for screening mammogram for malignant neoplasm of breast: Secondary | ICD-10-CM | POA: Diagnosis not present

## 2020-05-26 LAB — HM MAMMOGRAPHY: HM Mammogram: NORMAL (ref 0–4)

## 2020-05-28 ENCOUNTER — Encounter: Payer: Self-pay | Admitting: Family Medicine

## 2020-05-28 ENCOUNTER — Other Ambulatory Visit: Payer: Self-pay

## 2020-05-28 ENCOUNTER — Ambulatory Visit (INDEPENDENT_AMBULATORY_CARE_PROVIDER_SITE_OTHER): Payer: Medicare HMO | Admitting: Family Medicine

## 2020-05-28 VITALS — BP 118/70 | HR 80 | Temp 96.3°F | Ht 69.0 in | Wt 220.0 lb

## 2020-05-28 DIAGNOSIS — Z01818 Encounter for other preprocedural examination: Secondary | ICD-10-CM

## 2020-05-28 DIAGNOSIS — M1612 Unilateral primary osteoarthritis, left hip: Secondary | ICD-10-CM

## 2020-05-28 DIAGNOSIS — R109 Unspecified abdominal pain: Secondary | ICD-10-CM | POA: Diagnosis not present

## 2020-05-28 DIAGNOSIS — Z01810 Encounter for preprocedural cardiovascular examination: Secondary | ICD-10-CM

## 2020-05-28 LAB — POC URINALSYSI DIPSTICK (AUTOMATED)
Bilirubin, UA: NEGATIVE
Blood, UA: NEGATIVE
Glucose, UA: NEGATIVE
Ketones, UA: NEGATIVE
Leukocytes, UA: NEGATIVE
Nitrite, UA: NEGATIVE
Protein, UA: NEGATIVE
Spec Grav, UA: 1.01 (ref 1.010–1.025)
Urobilinogen, UA: 0.2 E.U./dL
pH, UA: 6 (ref 5.0–8.0)

## 2020-05-28 NOTE — Assessment & Plan Note (Signed)
Planning upcoming hip replacement with Dr Madelon Lips

## 2020-05-28 NOTE — Progress Notes (Signed)
Subjective:    Patient ID: Claudia Mcguire, female    DOB: 1955-12-10, 65 y.o.   MRN: [id number]  This visit occurred during the SARS-CoV-2 public health emergency.  Safety protocols were in place, including screening questions prior to the visit, additional usage of staff PPE, and extensive cleaning of exam room while observing appropriate contact time as indicated for disinfecting solutions.    HPI Pt presents for surgical clearance   Wt Readings from Last 3 Encounters:  05/28/20 220 lb (99.8 kg)  02/06/20 219 lb 11.2 oz (99.7 kg)  12/09/18 219 lb 9 oz (99.6 kg)   32.49 kg/m  Pre op clearance  Urination- ? If complete emptying  A little right flank pain  No rash  No dysuria  Some urge incontinence    Planning R total hip replacement on 06/27/20 with Dr Madelon Lips  In surgical center  She uses cane  Painful    No history of cardiac or pulmonary disorders No h/o blood clots   nsaids- aleve as needed or ibuprofen Some tylenol   No aspirin   No past surgeries  No problems with anesthesia with colonoscopy etc    Has osteopenia  No falls or fractures   Had covid vaccines incl booster and flu shot   No known drug allergies No h/o anaphylaxis   EKG today : NSR without acute changes  Read as low voltage in precordial leads    BP Readings from Last 3 Encounters:  05/28/20 118/70  02/06/20 108/70  12/09/18 126/86   Pulse Readings from Last 3 Encounters:  05/28/20 80  02/06/20 91  12/09/18 77   Former smoker, quit in 1987  Has history of migraines    Last labs were done in 11/21 Lab Results  Component Value Date   CREATININE 0.69 02/01/2020   BUN 10 02/01/2020   NA 136 02/01/2020   K 4.4 02/01/2020   CL 101 02/01/2020   CO2 31 02/01/2020  nl glucose 76 Lab Results  Component Value Date   ALT 52 (H) 02/01/2020   AST 49 (H) 02/01/2020   ALKPHOS 55 02/01/2020   BILITOT 0.6 02/01/2020    Lab Results  Component Value Date   WBC 4.5  02/01/2020   HGB 13.4 02/01/2020   HCT 39.9 02/01/2020   MCV 94.4 02/01/2020   PLT 264.0 02/01/2020    Clear ua today Results for orders placed or performed in visit on 05/28/20  POCT Urinalysis Dipstick (Automated)  Result Value Ref Range   Color, UA Light Yellow    Clarity, UA Clear    Glucose, UA Negative Negative   Bilirubin, UA Negative    Ketones, UA Negative    Spec Grav, UA 1.010 1.010 - 1.025   Blood, UA Negative    pH, UA 6.0 5.0 - 8.0   Protein, UA Negative Negative   Urobilinogen, UA 0.2 0.2 or 1.0 E.U./dL   Nitrite, UA Negative    Leukocytes, UA Negative Negative    Patient Active Problem List   Diagnosis Date Noted  . Pre-operative clearance 05/28/2020  . Right flank pain 05/28/2020  . OA (osteoarthritis) of hip 02/06/2020  . Welcome to Medicare preventive visit 12/09/2018  . Screening mammogram, encounter for 12/09/2018  . Estrogen deficiency 12/09/2018  . Encounter for hepatitis C screening test for low risk patient 12/09/2018  . Colon cancer screening 12/09/2018  . Routine general medical examination at a health care facility 08/09/2017  . Osteopenia 08/04/2017  . Diabetes mellitus  screening 08/04/2017  . Screening for lipoid disorders 08/04/2017  . Migraine with aura 12/01/2006   Past Medical History:  Diagnosis Date  . History of headache   . History of migraine    History reviewed. No pertinent surgical history. Social History   Tobacco Use  . Smoking status: Former Games developer  . Smokeless tobacco: Never Used  . Tobacco comment: quit back in 1987  Vaping Use  . Vaping Use: Never used  Substance Use Topics  . Alcohol use: Yes    Alcohol/week: 3.0 - 4.0 standard drinks    Types: 3 - 4 Cans of beer per week    Comment: daily  . Drug use: No   Family History  Problem Relation Age of Onset  . Hyperlipidemia Father   . Stroke Paternal Grandfather   . Diabetes Paternal Grandfather   . Stroke Paternal Uncle        2 uncles  . Dementia Mother     No Known Allergies Current Outpatient Medications on File Prior to Visit  Medication Sig Dispense Refill  . Ascorbic Acid (VITAMIN C) 1000 MG tablet Take 1,000 mg by mouth daily.    . Cholecalciferol (VITAMIN D3) 125 MCG (5000 UT) CAPS Take by mouth.    . diclofenac Sodium (VOLTAREN) 1 % GEL Apply topically 4 (four) times daily.    . Flaxseed, Linseed, (FLAX SEED OIL) 1000 MG CAPS Take by mouth.    . Multiple Vitamin (MULTIVITAMIN ADULT PO) Take by mouth.    . rizatriptan (MAXALT) 10 MG tablet TAKE ONE TABLET BY MOUTH AS NEEDED for migraine- may repeat in 2 hours if needed 10 tablet 11  . vitamin B-12 (CYANOCOBALAMIN) 1000 MCG tablet Take 1,000 mcg by mouth daily.     No current facility-administered medications on file prior to visit.     Review of Systems  Constitutional: Negative for activity change, appetite change, fatigue, fever and unexpected weight change.  HENT: Negative for congestion, ear pain, rhinorrhea, sinus pressure and sore throat.   Eyes: Negative for pain, redness and visual disturbance.  Respiratory: Negative for cough, shortness of breath and wheezing.   Cardiovascular: Negative for chest pain and palpitations.  Gastrointestinal: Negative for abdominal pain, blood in stool, constipation and diarrhea.  Endocrine: Negative for polydipsia and polyuria.  Genitourinary: Negative for dysuria, frequency and urgency.  Musculoskeletal: Positive for arthralgias. Negative for back pain and myalgias.       Hip pain   Skin: Negative for pallor and rash.  Allergic/Immunologic: Negative for environmental allergies.  Neurological: Negative for dizziness, syncope and headaches.  Hematological: Negative for adenopathy. Does not bruise/bleed easily.  Psychiatric/Behavioral: Negative for decreased concentration and dysphoric mood. The patient is not nervous/anxious.        Objective:   Physical Exam Constitutional:      General: She is not in acute distress.    Appearance:  Normal appearance. She is well-developed and well-nourished. She is obese. She is not ill-appearing.  HENT:     Head: Normocephalic and atraumatic.     Mouth/Throat:     Mouth: Oropharynx is clear and moist.  Eyes:     Extraocular Movements: EOM normal.     Conjunctiva/sclera: Conjunctivae normal.     Pupils: Pupils are equal, round, and reactive to light.  Neck:     Thyroid: No thyromegaly.     Vascular: No carotid bruit or JVD.  Cardiovascular:     Rate and Rhythm: Normal rate and regular rhythm.  Pulses: Intact distal pulses.     Heart sounds: Normal heart sounds. No gallop.   Pulmonary:     Effort: Pulmonary effort is normal. No respiratory distress.     Breath sounds: Normal breath sounds. No wheezing or rales.     Comments: No crackles Abdominal:     General: Bowel sounds are normal. There is no distension or abdominal bruit.     Palpations: Abdomen is soft.     Tenderness: There is no abdominal tenderness. There is no right CVA tenderness or left CVA tenderness.  Musculoskeletal:        General: No edema.     Cervical back: Normal range of motion and neck supple.     Comments: Limited hip rom, gait is affected  Lymphadenopathy:     Cervical: No cervical adenopathy.  Skin:    General: Skin is warm and dry.     Coloration: Skin is not jaundiced or pale.     Findings: No erythema or rash.  Neurological:     Mental Status: She is alert.     Coordination: Coordination normal.     Deep Tendon Reflexes: Reflexes are normal and symmetric. Reflexes normal.  Psychiatric:        Mood and Affect: Mood and affect and mood normal.           Assessment & Plan:   Problem List Items Addressed This Visit      Musculoskeletal and Integument   OA (osteoarthritis) of hip    Planning upcoming hip replacement with Dr Madelon Lips        Other   Pre-operative clearance - Primary    No restrictions for upcoming surgery  No hx of anethesia problems or drug allergies Plans to  stop nsaids 7 d prior to surgery  Reassuring exam and EKG  No h/o heart/lung problems or blood clots Nl vitals  Low risk        Right flank pain    Suspect muscular  ua clear       Relevant Orders   POCT Urinalysis Dipstick (Automated) (Completed)    Other Visit Diagnoses    Pre-operative cardiovascular examination       Relevant Orders   EKG 12-Lead (Completed)

## 2020-05-28 NOTE — Assessment & Plan Note (Signed)
No restrictions for upcoming surgery  No hx of anethesia problems or drug allergies Plans to stop nsaids 7 d prior to surgery  Reassuring exam and EKG  No h/o heart/lung problems or blood clots Nl vitals  Low risk

## 2020-05-28 NOTE — Patient Instructions (Addendum)
Hold your anti inflammatories for the week before surgery - (aleve, ibuprofen)   Drink lots of water   Leave a urine sample today - we will check it and get back to you

## 2020-05-28 NOTE — Assessment & Plan Note (Signed)
Suspect muscular  ua clear

## 2020-05-30 DIAGNOSIS — M25561 Pain in right knee: Secondary | ICD-10-CM | POA: Diagnosis not present

## 2020-05-30 DIAGNOSIS — M1711 Unilateral primary osteoarthritis, right knee: Secondary | ICD-10-CM | POA: Diagnosis not present

## 2020-06-04 DIAGNOSIS — M1611 Unilateral primary osteoarthritis, right hip: Secondary | ICD-10-CM | POA: Diagnosis not present

## 2020-06-05 DIAGNOSIS — M1611 Unilateral primary osteoarthritis, right hip: Secondary | ICD-10-CM | POA: Diagnosis not present

## 2020-06-05 DIAGNOSIS — M6281 Muscle weakness (generalized): Secondary | ICD-10-CM | POA: Diagnosis not present

## 2020-06-05 DIAGNOSIS — R262 Difficulty in walking, not elsewhere classified: Secondary | ICD-10-CM | POA: Diagnosis not present

## 2020-06-13 DIAGNOSIS — G8929 Other chronic pain: Secondary | ICD-10-CM | POA: Diagnosis not present

## 2020-06-13 DIAGNOSIS — Z791 Long term (current) use of non-steroidal anti-inflammatories (NSAID): Secondary | ICD-10-CM | POA: Diagnosis not present

## 2020-06-13 DIAGNOSIS — Z008 Encounter for other general examination: Secondary | ICD-10-CM | POA: Diagnosis not present

## 2020-06-13 DIAGNOSIS — Z6834 Body mass index (BMI) 34.0-34.9, adult: Secondary | ICD-10-CM | POA: Diagnosis not present

## 2020-06-13 DIAGNOSIS — G43909 Migraine, unspecified, not intractable, without status migrainosus: Secondary | ICD-10-CM | POA: Diagnosis not present

## 2020-06-13 DIAGNOSIS — E669 Obesity, unspecified: Secondary | ICD-10-CM | POA: Diagnosis not present

## 2020-06-13 DIAGNOSIS — M8580 Other specified disorders of bone density and structure, unspecified site: Secondary | ICD-10-CM | POA: Diagnosis not present

## 2020-06-13 DIAGNOSIS — R030 Elevated blood-pressure reading, without diagnosis of hypertension: Secondary | ICD-10-CM | POA: Diagnosis not present

## 2020-06-13 DIAGNOSIS — R32 Unspecified urinary incontinence: Secondary | ICD-10-CM | POA: Diagnosis not present

## 2020-06-13 DIAGNOSIS — M1990 Unspecified osteoarthritis, unspecified site: Secondary | ICD-10-CM | POA: Diagnosis not present

## 2020-06-13 DIAGNOSIS — I739 Peripheral vascular disease, unspecified: Secondary | ICD-10-CM | POA: Diagnosis not present

## 2020-06-27 DIAGNOSIS — M1611 Unilateral primary osteoarthritis, right hip: Secondary | ICD-10-CM | POA: Diagnosis not present

## 2020-06-28 DIAGNOSIS — M1611 Unilateral primary osteoarthritis, right hip: Secondary | ICD-10-CM | POA: Diagnosis not present

## 2020-06-29 DIAGNOSIS — M6281 Muscle weakness (generalized): Secondary | ICD-10-CM | POA: Diagnosis not present

## 2020-06-29 DIAGNOSIS — M1611 Unilateral primary osteoarthritis, right hip: Secondary | ICD-10-CM | POA: Diagnosis not present

## 2020-06-29 DIAGNOSIS — R262 Difficulty in walking, not elsewhere classified: Secondary | ICD-10-CM | POA: Diagnosis not present

## 2020-06-29 DIAGNOSIS — M25651 Stiffness of right hip, not elsewhere classified: Secondary | ICD-10-CM | POA: Diagnosis not present

## 2020-07-05 DIAGNOSIS — M1611 Unilateral primary osteoarthritis, right hip: Secondary | ICD-10-CM | POA: Diagnosis not present

## 2020-07-12 ENCOUNTER — Ambulatory Visit (HOSPITAL_COMMUNITY): Payer: Medicare HMO

## 2020-07-12 ENCOUNTER — Other Ambulatory Visit (HOSPITAL_COMMUNITY): Payer: Self-pay | Admitting: Orthopedic Surgery

## 2020-07-12 DIAGNOSIS — M7989 Other specified soft tissue disorders: Secondary | ICD-10-CM

## 2020-07-12 DIAGNOSIS — M79604 Pain in right leg: Secondary | ICD-10-CM

## 2020-07-17 ENCOUNTER — Other Ambulatory Visit: Payer: Self-pay

## 2020-07-17 ENCOUNTER — Ambulatory Visit (HOSPITAL_COMMUNITY)
Admission: RE | Admit: 2020-07-17 | Discharge: 2020-07-17 | Disposition: A | Discharge status: Home / Self Care | Payer: Medicare HMO | Source: Ambulatory Visit | Attending: Orthopedic Surgery | Admitting: Orthopedic Surgery

## 2020-07-17 DIAGNOSIS — M79604 Pain in right leg: Secondary | ICD-10-CM | POA: Diagnosis not present

## 2020-07-17 DIAGNOSIS — M7989 Other specified soft tissue disorders: Secondary | ICD-10-CM | POA: Insufficient documentation

## 2020-07-17 NOTE — Progress Notes (Signed)
RLE venous duplex completed.  Preliminary results called to Josh at 1135.  Results can be found under chart review under CV PROC. 07/17/2020 11:51 AM Moneka Mcquinn RVT, RDMS

## 2020-08-09 DIAGNOSIS — M1611 Unilateral primary osteoarthritis, right hip: Secondary | ICD-10-CM | POA: Diagnosis not present

## 2020-11-19 DIAGNOSIS — M1611 Unilateral primary osteoarthritis, right hip: Secondary | ICD-10-CM | POA: Diagnosis not present

## 2021-01-30 ENCOUNTER — Telehealth: Payer: Self-pay | Admitting: Family Medicine

## 2021-01-30 DIAGNOSIS — Z0000 Encounter for general adult medical examination without abnormal findings: Secondary | ICD-10-CM

## 2021-01-30 NOTE — Telephone Encounter (Signed)
-----   Message from Alvina Chou sent at 01/16/2021  3:35 PM EDT ----- Regarding: Lab orders for Thursday, 11.3.22 Patient is scheduled for CPX labs, please order future labs, Thanks , Camelia Eng

## 2021-01-31 ENCOUNTER — Other Ambulatory Visit: Payer: Medicare HMO

## 2021-02-07 ENCOUNTER — Encounter: Payer: Self-pay | Admitting: Family Medicine

## 2021-02-07 ENCOUNTER — Ambulatory Visit (INDEPENDENT_AMBULATORY_CARE_PROVIDER_SITE_OTHER): Payer: Medicare HMO | Admitting: Family Medicine

## 2021-02-07 ENCOUNTER — Other Ambulatory Visit: Payer: Self-pay

## 2021-02-07 VITALS — BP 130/80 | HR 73 | Temp 97.9°F | Ht 68.5 in | Wt 221.0 lb

## 2021-02-07 DIAGNOSIS — Z1231 Encounter for screening mammogram for malignant neoplasm of breast: Secondary | ICD-10-CM

## 2021-02-07 DIAGNOSIS — Z0000 Encounter for general adult medical examination without abnormal findings: Secondary | ICD-10-CM | POA: Diagnosis not present

## 2021-02-07 DIAGNOSIS — M85859 Other specified disorders of bone density and structure, unspecified thigh: Secondary | ICD-10-CM | POA: Diagnosis not present

## 2021-02-07 DIAGNOSIS — E2839 Other primary ovarian failure: Secondary | ICD-10-CM

## 2021-02-07 DIAGNOSIS — H9193 Unspecified hearing loss, bilateral: Secondary | ICD-10-CM

## 2021-02-07 DIAGNOSIS — H9190 Unspecified hearing loss, unspecified ear: Secondary | ICD-10-CM | POA: Insufficient documentation

## 2021-02-07 LAB — CBC WITH DIFFERENTIAL/PLATELET
Basophils Absolute: 0.1 10*3/uL (ref 0.0–0.1)
Basophils Relative: 1.3 % (ref 0.0–3.0)
Eosinophils Absolute: 0.2 10*3/uL (ref 0.0–0.7)
Eosinophils Relative: 4 % (ref 0.0–5.0)
HCT: 39.3 % (ref 36.0–46.0)
Hemoglobin: 12.8 g/dL (ref 12.0–15.0)
Lymphocytes Relative: 43.5 % (ref 12.0–46.0)
Lymphs Abs: 2.1 10*3/uL (ref 0.7–4.0)
MCHC: 32.6 g/dL (ref 30.0–36.0)
MCV: 86.9 fl (ref 78.0–100.0)
Monocytes Absolute: 0.6 10*3/uL (ref 0.1–1.0)
Monocytes Relative: 11.6 % (ref 3.0–12.0)
Neutro Abs: 1.9 10*3/uL (ref 1.4–7.7)
Neutrophils Relative %: 39.6 % — ABNORMAL LOW (ref 43.0–77.0)
Platelets: 318 10*3/uL (ref 150.0–400.0)
RBC: 4.52 Mil/uL (ref 3.87–5.11)
RDW: 15.9 % — ABNORMAL HIGH (ref 11.5–15.5)
WBC: 4.9 10*3/uL (ref 4.0–10.5)

## 2021-02-07 LAB — LIPID PANEL
Cholesterol: 184 mg/dL (ref 0–200)
HDL: 80.3 mg/dL (ref >39.00)
LDL Cholesterol: 94 mg/dL (ref 0–99)
NonHDL: 103.31
Total CHOL/HDL Ratio: 2
Triglycerides: 49 mg/dL (ref 0.0–149.0)
VLDL: 9.8 mg/dL (ref 0.0–40.0)

## 2021-02-07 LAB — COMPREHENSIVE METABOLIC PANEL
ALT: 53 U/L — ABNORMAL HIGH (ref 0–35)
AST: 54 U/L — ABNORMAL HIGH (ref 0–37)
Albumin: 4.2 g/dL (ref 3.5–5.2)
Alkaline Phosphatase: 71 U/L (ref 39–117)
BUN: 11 mg/dL (ref 6–23)
CO2: 28 mEq/L (ref 19–32)
Calcium: 9.4 mg/dL (ref 8.4–10.5)
Chloride: 103 mEq/L (ref 96–112)
Creatinine, Ser: 0.64 mg/dL (ref 0.40–1.20)
GFR: 91.53 mL/min (ref >60.00)
Glucose, Bld: 86 mg/dL (ref 70–99)
Potassium: 4.2 mEq/L (ref 3.5–5.1)
Sodium: 137 mEq/L (ref 135–145)
Total Bilirubin: 0.5 mg/dL (ref 0.2–1.2)
Total Protein: 6.9 g/dL (ref 6.0–8.3)

## 2021-02-07 LAB — TSH: TSH: 0.89 u[IU]/mL (ref 0.35–5.50)

## 2021-02-07 NOTE — Assessment & Plan Note (Signed)
Reviewed health habits including diet and exercise and skin cancer prevention Reviewed appropriate screening tests for age  Also reviewed health mt list, fam hx and immunization status , as well as social and family history   See HPI Labs ordered Colonoscopy utd Mammogram utd at gyn/sent for report utd imms  No falls or fractures, dexa ordered  Advance directive is up to date No cognitive concerns Failed hearing screen-ref made to audiology Reassuring vision screen PHQ score of 0

## 2021-02-07 NOTE — Assessment & Plan Note (Signed)
Sent for mammogram report from her gyn office

## 2021-02-07 NOTE — Assessment & Plan Note (Signed)
Bilateral and worsening  Desires audiology eval  Ref done  May benefit from hearing aides

## 2021-02-07 NOTE — Assessment & Plan Note (Signed)
Due for 2 year dexa Referral done Pt will schedule it at the breast center of [facility] to continue exercising and taking vit D No falls or fractures

## 2021-02-07 NOTE — Patient Instructions (Addendum)
I ordered a bone density test  You can call to order it at the breast center   I placed an audiology referral to check out hearing  You will get a call   Labs today     Please call the location of your choice from the menu below to schedule your Mammogram and/or Bone Density appointment.    [facility]   [facility]                      Phone:  [phone] [address]                               [place], [place] [address]                                                             Services: Traditional and 3D Mammogram, Bone Density   [facility]                 Phone: [phone] [address]                                                       [place], [place] [address]    Service: Bone Density ONLY   *this site does NOT perform mammograms  [facility]                        Phone:  [phone] [address]                                  [place], [place] [address]                                            Services:  3D Mammogram and Bone Density    [facility]   Phone:  [phone]   [address]                                                                            [place], [place] [address]                                            Services: 3D Mammogram and Bone [facility])  Phone:  [phone]   [address]  [place], [place] [address]                                              Services:  3D Mammogram and Bone Density

## 2021-02-07 NOTE — Progress Notes (Signed)
Subjective:    Patient ID: Claudia Mcguire, female    DOB: [birth year], 65 y.o.   MRN: [id number]  This visit occurred during the SARS-CoV-2 public health emergency.  Safety protocols were in place, including screening questions prior to the visit, additional usage of staff PPE, and extensive cleaning of exam room while observing appropriate contact time as indicated for disinfecting solutions.   HPI Pt presents for amw and health mt visit   I have personally reviewed the Medicare Annual Wellness questionnaire and have noted 1. The patient's medical and social history 2. Their use of alcohol, tobacco or illicit drugs 3. Their current medications and supplements 4. The patient's functional ability including ADL's, fall risks, home safety risks and hearing or visual             impairment. 5. Diet and physical activities 6. Evidence for depression or mood disorders  The patients weight, height, BMI have been recorded in the chart and visual acuity is per eye clinic.  I have made referrals, counseling and provided education to the patient based review of the above and I have provided the pt with a written personalized care plan for preventive services. Reviewed and updated provider list, see scanned forms.  See scanned forms.  Routine anticipatory guidance given to patient.  See health maintenance. Colon cancer screening  colonoscopy 12/2019 Breast cancer screening  mammogram 11/2018, did have a mammogram at gyn Dr Marvel Plan in the past year , normal  Self breast exam-no lumps  Flu vaccine: had it this fall  Tetanus vaccine   07/2017 Tdap Pneumovax completed  Covid vaccinated Zoster vaccine: shingrix /one Dexa 11/2018- with slt worsening at [facility] Falls-none Fractures-none Supplements-takes vit D and mvi daily  Exercise : good since end of august, daily 40 or more mintues   Had hip replacement end of march  Advance directive: up to date  Cognitive function addressed- see scanned  forms- and if abnormal then additional documentation follows.   No concerns  No difference from her friends  No confusion  Does not misplace things   Does socialize  Not enough reading but on the computer  Does her finances  Plays cards on line   PMH and SH reviewed  Meds, vitals, and allergies reviewed.   ROS: See HPI.  Otherwise negative.    Weight : Wt Readings from Last 3 Encounters:  02/07/21 221 lb (100.2 kg)  05/28/20 220 lb (99.8 kg)  02/06/20 219 lb 11.2 oz (99.7 kg)   33.11 kg/m   Hearing/vision: Hearing Screening   500Hz  1000Hz  2000Hz  4000Hz   Right ear 0 40 40 0  Left ear 0 0 0 0   Vision Screening   Right eye Left eye Both eyes  Without correction 20/25 20/20 20/20   With correction       Hearing is a problem  Interested in audiology  Has not been to the eye doctor for a while  Wears readers     PHQ: Depression screen [facility] 2/9 02/07/2021 02/06/2020 02/06/2020 12/09/2018  Decreased Interest 0 0 0 0  Down, Depressed, Hopeless 0 0 0 0  PHQ - 2 Score 0 0 0 0   Mood is good   ADLs: no help needed  Functionality: very good  Better since hip was fixed   Care team : Angas [facility]ell-pcp [facility]o  BP Readings from Last 3 Encounters:  02/07/21 130/80  05/28/20 118/70  02/06/20 108/70    Pulse Readings from Last 3 Encounters:  02/07/21 73  05/28/20 80  02/06/20 91   Has some rib/chest wall pain if she sits on L side of a plane  Only occurs if she sits to the L of the isle , not on the other side and no other time  No tenderness and no trauma known No exertional symptoms   Patient Active Problem List   Diagnosis Date Noted   Hearing loss 02/07/2021   Pre-operative clearance 05/28/2020   Right flank pain 05/28/2020   OA (osteoarthritis) of hip 02/06/2020   Medicare annual wellness visit, subsequent 12/09/2018   Screening mammogram, encounter for 12/09/2018   Estrogen deficiency 12/09/2018   Encounter for hepatitis C screening test for low  risk patient 12/09/2018   Colon cancer screening 12/09/2018   Routine general medical examination at a health care facility 08/09/2017   Osteopenia 08/04/2017   Diabetes mellitus screening 08/04/2017   Screening for lipoid disorders 08/04/2017   Migraine with aura 12/01/2006   Past Medical History:  Diagnosis Date   History of headache    History of migraine    No past surgical history on file. Social History   Tobacco Use   Smoking status: Former   Smokeless tobacco: Never   Tobacco comments:    quit back in 1987  Vaping Use   Vaping Use: Never used  Substance Use Topics   Alcohol use: Yes    Alcohol/week: 3.0 - 4.0 standard drinks    Types: 3 - 4 Cans of beer per week    Comment: daily   Drug use: No   Family History  Problem Relation Age of Onset   Hyperlipidemia Father    Stroke Paternal Grandfather    Diabetes Paternal Grandfather    Stroke Paternal Uncle        2 uncles   Dementia Mother    No Known Allergies Current Outpatient Medications on File Prior to Visit  Medication Sig Dispense Refill   Ascorbic Acid (VITAMIN C) 1000 MG tablet Take 1,000 mg by mouth daily.     Cholecalciferol (VITAMIN D3) 125 MCG (5000 UT) CAPS Take by mouth.     diclofenac Sodium (VOLTAREN) 1 % GEL Apply topically daily as needed.     Flaxseed, Linseed, (FLAX SEED OIL) 1000 MG CAPS Take by mouth.     Multiple Vitamin (MULTIVITAMIN ADULT PO) Take by mouth.     rizatriptan (MAXALT) 10 MG tablet TAKE ONE TABLET BY MOUTH AS NEEDED for migraine- may repeat in 2 hours if needed 10 tablet 11   vitamin B-12 (CYANOCOBALAMIN) 1000 MCG tablet Take 1,000 mcg by mouth daily.     No current facility-administered medications on file prior to visit.    Review of Systems  Constitutional:  Negative for activity change, appetite change, fatigue, fever and unexpected weight change.  HENT:  Negative for congestion, ear pain, rhinorrhea, sinus pressure and sore throat.   Eyes:  Negative for pain,  redness and visual disturbance.  Respiratory:  Negative for cough, shortness of breath and wheezing.   Cardiovascular:  Negative for chest pain and palpitations.  Gastrointestinal:  Negative for abdominal pain, blood in stool, constipation and diarrhea.  Endocrine: Negative for polydipsia and polyuria.  Genitourinary:  Negative for dysuria, frequency and urgency.  Musculoskeletal:  Negative for arthralgias, back pain and myalgias.       Occ rib pain when sitting to L of isle of plane  Hip is improving   Skin:  Negative for pallor and rash.  Allergic/Immunologic: Negative for environmental allergies.  Neurological:  Negative for dizziness, syncope and headaches.  Hematological:  Negative for adenopathy. Does not bruise/bleed easily.  Psychiatric/Behavioral:  Negative for decreased concentration and dysphoric mood. The patient is not nervous/anxious.       Objective:   Physical Exam Constitutional:      General: She is not in acute distress.    Appearance: Normal appearance. She is well-developed. She is obese. She is not ill-appearing or diaphoretic.  HENT:     Head: Normocephalic and atraumatic.     Right Ear: Tympanic membrane, ear canal and external ear normal.     Left Ear: Tympanic membrane, ear canal and external ear normal.     Nose: Nose normal. No congestion.     Mouth/Throat:     Mouth: Mucous membranes are moist.     Pharynx: Oropharynx is clear. No posterior oropharyngeal erythema.  Eyes:     General: No scleral icterus.    Extraocular Movements: Extraocular movements intact.     Conjunctiva/sclera: Conjunctivae normal.     Pupils: Pupils are equal, round, and reactive to light.  Neck:     Thyroid: No thyromegaly.     Vascular: No carotid bruit or JVD.  Cardiovascular:     Rate and Rhythm: Normal rate and regular rhythm.     Pulses: Normal pulses.     Heart sounds: Normal heart sounds.    No gallop.  Pulmonary:     Effort: Pulmonary effort is normal. No  respiratory distress.     Breath sounds: Normal breath sounds. No wheezing.     Comments: Good air exch Chest:     Chest wall: No tenderness.  Abdominal:     General: Bowel sounds are normal. There is no distension or abdominal bruit.     Palpations: Abdomen is soft. There is no mass.     Tenderness: There is no abdominal tenderness.     Hernia: No hernia is present.  Genitourinary:    Comments: Breast exam: No mass, nodules, thickening, tenderness, bulging, retraction, inflamation, nipple discharge or skin changes noted.  No axillary or clavicular LA.     Musculoskeletal:        General: No tenderness. Normal range of motion.     Cervical back: Normal range of motion and neck supple. No rigidity. No muscular tenderness.     Right lower leg: No edema.     Left lower leg: No edema.     Comments: No kyphosis  No bony tenderness  Lymphadenopathy:     Cervical: No cervical adenopathy.  Skin:    General: Skin is warm and dry.     Coloration: Skin is not pale.     Findings: No erythema or rash.     Comments: Solar lentigines diffusely   Neurological:     Mental Status: She is alert. Mental status is at baseline.     Cranial Nerves: No cranial nerve deficit.     Motor: No abnormal muscle tone.     Coordination: Coordination normal.     Gait: Gait normal.     Deep Tendon Reflexes: Reflexes are normal and symmetric. Reflexes normal.  Psychiatric:        Mood and Affect: Mood normal.        Cognition and Memory: Cognition and memory normal.          Assessment & Plan:   Problem List Items Addressed This Visit       Nervous and Auditory   Hearing loss  Bilateral and worsening  Desires audiology eval  Ref done  May benefit from hearing aides      Relevant Orders   Ambulatory referral to Audiology     Musculoskeletal and Integument   Osteopenia    Due for 2 year dexa Referral done Pt will schedule it at the breast center of [facility] to continue exercising and taking  vit D No falls or fractures         Other   Routine general medical examination at a health care facility    Reviewed health habits including diet and exercise and skin cancer prevention Reviewed appropriate screening tests for age  Also reviewed health mt list, fam hx and immunization status , as well as social and family history   See HPI Labs ordered Colonoscopy utd Mammogram utd at gyn/sent for report utd imms  No falls or fractures, dexa ordered  Advance directive is up to date No cognitive concerns Failed hearing screen-ref made to audiology Reassuring vision screen PHQ score of 0      Medicare annual wellness visit, subsequent - Primary    Reviewed health habits including diet and exercise and skin cancer prevention Reviewed appropriate screening tests for age  Also reviewed health mt list, fam hx and immunization status , as well as social and family history   See HPI Labs ordered Colonoscopy utd Mammogram utd at gyn/sent for report utd imms  No falls or fractures, dexa ordered  Advance directive is up to date No cognitive concerns Failed hearing screen-ref made to audiology Reassuring vision screen PHQ score of 0      Screening mammogram, encounter for   Estrogen deficiency   Relevant Orders   DG Bone Density

## 2021-02-08 ENCOUNTER — Encounter: Payer: Self-pay | Admitting: Family Medicine

## 2021-03-06 ENCOUNTER — Ambulatory Visit: Payer: Medicare HMO | Admitting: Audiologist

## 2021-03-13 ENCOUNTER — Other Ambulatory Visit: Payer: Self-pay

## 2021-03-13 ENCOUNTER — Telehealth: Payer: Self-pay | Admitting: Family Medicine

## 2021-03-13 ENCOUNTER — Ambulatory Visit: Payer: Medicare HMO | Attending: Family Medicine | Admitting: Audiologist

## 2021-03-13 DIAGNOSIS — H9193 Unspecified hearing loss, bilateral: Secondary | ICD-10-CM

## 2021-03-13 DIAGNOSIS — H903 Sensorineural hearing loss, bilateral: Secondary | ICD-10-CM | POA: Diagnosis not present

## 2021-03-13 NOTE — Procedures (Signed)
°  Outpatient Audiology and [facility] [address] [place], [place]  [address] [phone]  AUDIOLOGICAL  EVALUATION  NAME: Claudia Mcguire     DOB:   02-19-1956      MRN: [id number]                                                                                     DATE: 03/13/2021     REFERENT: Judy Pimple, MD STATUS: Outpatient DIAGNOSIS: Asymmetric Sensorineural Hearing Loss     History: [name] was seen for an audiological evaluation.  [name] is receiving a hearing evaluation due to concerns for long standing difficulty hearing. [name] has difficulty hearing at all times. She has always noticed that the left ear is worse. She has been struggling to hear for years. This difficulty began gradually. No pain reported in either ear. She has some occasional pressure in her ears that correlated to sinus issues and changes in weather. Tinnitus present in both ears and is louder in the left. 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 Her family has a history of hearing loss and her father was deaf later in life.  Medical history negative for a condition which is a risk factor for hearing loss. No other relevant case history reported.    Evaluation:  Otoscopy showed a clear view of the tympanic membranes, bilaterally Tympanometry results were consistent with normal but shallow, bilaterally   Audiometric testing was completed using conventional audiometry with inserts and supraural high frequency transducer. Speech Recognition Thresholds were  consistent with pure tone averages, 40dB in the right ear and 55dB in the left ear. Word Recognition was excellent at 5dB below UCL for speech, which is 85dB. Pure tone thresholds show mild to moderate sloping sensorineural hearing loss in the right ear and mild sloping to profound mixed hearing loss in the left ear with a conductive component at 4k Hz only. Test results are  consistent with asymmetric hearing loss.  Both ears crosscheck with inserts, no changes.  Results:  The test results were reviewed with Lanora Manis. The difference between her hears is significant enough that she needs to see Otolaryngology. She will need medical clearance before being fit with a hearing aid.    Recommendations: Amplification is necessary for both ears. Hearing aids can be purchased from a variety of locations. See provided list for locations in the [place] area.  Referral to ENT Physician necessary due to asymmetric hearing loss. Tower, Audrie Gallus, MD please send a referral to Otolaryngology for [employer].    Ammie Ferrier  Audiologist, Au.D., CCC-A 03/13/2021  11:51 AM  Cc: Tower, Audrie Gallus, MD

## 2021-03-13 NOTE — Telephone Encounter (Signed)
I put referral in to ENT for hearing loss  Unsure how long it will take  Tell pt if she does not hear in 2 wk to call us

## 2021-03-13 NOTE — Telephone Encounter (Signed)
-----   Message from Barrie Folk, AUD sent at 03/13/2021  2:33 PM EST ----- Dr. Mat Carne has a significant asymmetric in her hearing loss. This warrants a medical evaluation with Otolaryngology for clearance for hearing aids. Please send a referral to [facility] ENT for a medical evaluation and hearing aid consult. Please include this report and the audiogram under media. Please let me know if you have questions or concerns.  Ammie Ferrier Au.D.  Audiologist

## 2021-03-14 NOTE — Telephone Encounter (Signed)
Mychart to pt:  Claudia Mcguire,  Dr. Milinda Antis put a referral in to ENT for your hearing loss. Unsure how long it will take to get a call from them but if you do not hear from their office within 2 wks, please call us.   Thanks, [employer]

## 2021-04-25 DIAGNOSIS — H903 Sensorineural hearing loss, bilateral: Secondary | ICD-10-CM | POA: Diagnosis not present

## 2021-06-14 ENCOUNTER — Other Ambulatory Visit: Payer: Self-pay | Admitting: Physician Assistant

## 2021-06-14 DIAGNOSIS — H903 Sensorineural hearing loss, bilateral: Secondary | ICD-10-CM

## 2021-06-19 DIAGNOSIS — Z1231 Encounter for screening mammogram for malignant neoplasm of breast: Secondary | ICD-10-CM | POA: Diagnosis not present

## 2021-06-19 DIAGNOSIS — Z01419 Encounter for gynecological examination (general) (routine) without abnormal findings: Secondary | ICD-10-CM | POA: Diagnosis not present

## 2021-07-17 ENCOUNTER — Ambulatory Visit
Admission: RE | Admit: 2021-07-17 | Discharge: 2021-07-17 | Disposition: A | Discharge status: Home / Self Care | Payer: Medicare HMO | Source: Ambulatory Visit | Attending: Physician Assistant | Admitting: Physician Assistant

## 2021-07-17 DIAGNOSIS — H748X1 Other specified disorders of right middle ear and mastoid: Secondary | ICD-10-CM | POA: Diagnosis not present

## 2021-07-17 DIAGNOSIS — J329 Chronic sinusitis, unspecified: Secondary | ICD-10-CM | POA: Diagnosis not present

## 2021-07-17 DIAGNOSIS — H903 Sensorineural hearing loss, bilateral: Secondary | ICD-10-CM | POA: Diagnosis not present

## 2021-07-17 MED ORDER — GADOBENATE DIMEGLUMINE 529 MG/ML IV SOLN
20.0000 mL | Freq: Once | INTRAVENOUS | Status: AC | PRN
  Administered 2021-07-17: 20 mL via INTRAVENOUS

## 2021-07-18 ENCOUNTER — Ambulatory Visit
Admission: RE | Admit: 2021-07-18 | Discharge: 2021-07-18 | Disposition: A | Discharge status: Home / Self Care | Payer: Medicare HMO | Source: Ambulatory Visit | Attending: Family Medicine | Admitting: Family Medicine

## 2021-07-18 DIAGNOSIS — M8589 Other specified disorders of bone density and structure, multiple sites: Secondary | ICD-10-CM | POA: Diagnosis not present

## 2021-07-18 DIAGNOSIS — E2839 Other primary ovarian failure: Secondary | ICD-10-CM

## 2021-07-18 DIAGNOSIS — Z780 Asymptomatic menopausal state: Secondary | ICD-10-CM | POA: Diagnosis not present

## 2022-02-04 ENCOUNTER — Telehealth: Payer: Self-pay | Admitting: Family Medicine

## 2022-02-04 NOTE — Telephone Encounter (Signed)
LVM for pt to rtn my call to schedule AWV with [facility] call back # [phone] 

## 2022-02-13 ENCOUNTER — Ambulatory Visit (INDEPENDENT_AMBULATORY_CARE_PROVIDER_SITE_OTHER): Payer: Medicare HMO

## 2022-02-13 VITALS — Ht 68.5 in | Wt 221.0 lb

## 2022-02-13 DIAGNOSIS — Z0000 Encounter for general adult medical examination without abnormal findings: Secondary | ICD-10-CM | POA: Diagnosis not present

## 2022-02-13 NOTE — Patient Instructions (Addendum)
Claudia Mcguire , Thank you for taking time to come for your Medicare Wellness Visit. I appreciate your ongoing commitment to your health goals. Please review the following plan we discussed and let me know if I can assist you in the future.   These are the goals we discussed:  Goals      Increase physical activity     Join the [employer] Program        This is a list of the screening recommended for you and due dates:  Health Maintenance  Topic Date Due   COVID-19 Vaccine (5 - Pfizer series) 05/04/2022   Mammogram  05/26/2022   Medicare Annual Wellness Visit  02/14/2023   Tetanus Vaccine  08/05/2027   Colon Cancer Screening  01/09/2030   Pneumonia Vaccine  Completed   Flu Shot  Completed   DEXA scan (bone density measurement)  Completed   Hepatitis C Screening: USPSTF Recommendation to screen - Ages 9-79 yo.  Completed   Zoster (Shingles) Vaccine  Completed   HPV Vaccine  Aged Out    Advanced directives: on file  Conditions/risks identified: none new  Next appointment: Follow up in one year for your annual wellness visit    Preventive Care 65 Years and Older, Female Preventive care refers to lifestyle choices and visits with your health care provider that can promote health and wellness. What does preventive care include? A yearly physical exam. This is also called an annual well check. Dental exams once or twice a year. Routine eye exams. Ask your health care provider how often you should have your eyes checked. Personal lifestyle choices, including: Daily care of your teeth and gums. Regular physical activity. Eating a healthy diet. Avoiding tobacco and drug use. Limiting alcohol use. Practicing safe sex. Taking low-dose aspirin every day. Taking vitamin and mineral supplements as recommended by your health care provider. What happens during an annual well check? The services and screenings done by your health care provider during your annual well check will  depend on your age, overall health, lifestyle risk factors, and family history of disease. Counseling  Your health care provider may ask you questions about your: Alcohol use. Tobacco use. Drug use. Emotional well-being. Home and relationship well-being. Sexual activity. Eating habits. History of falls. Memory and ability to understand (cognition). Work and work Astronomer. Reproductive health. Screening  You may have the following tests or measurements: Height, weight, and BMI. Blood pressure. Lipid and cholesterol levels. These may be checked every 5 years, or more frequently if you are over 60 years old. Skin check. Lung cancer screening. You may have this screening every year starting at age 2 if you have a 30-pack-year history of smoking and currently smoke or have quit within the past 15 years. Fecal occult blood test (FOBT) of the stool. You may have this test every year starting at age 4. Flexible sigmoidoscopy or colonoscopy. You may have a sigmoidoscopy every 5 years or a colonoscopy every 10 years starting at age 18. Hepatitis C blood test. Hepatitis B blood test. Sexually transmitted disease (STD) testing. Diabetes screening. This is done by checking your blood sugar (glucose) after you have not eaten for a while (fasting). You may have this done every 1-3 years. Bone density scan. This is done to screen for osteoporosis. You may have this done starting at age 27. Mammogram. This may be done every 1-2 years. Talk to your health care provider about how often you should have regular mammograms. Talk with  your health care provider about your test results, treatment options, and if necessary, the need for more tests. Vaccines  Your health care provider may recommend certain vaccines, such as: Influenza vaccine. This is recommended every year. Tetanus, diphtheria, and acellular pertussis (Tdap, Td) vaccine. You may need a Td booster every 10 years. Zoster vaccine. You may  need this after age 54. Pneumococcal 13-valent conjugate (PCV13) vaccine. One dose is recommended after age 25. Pneumococcal polysaccharide (PPSV23) vaccine. One dose is recommended after age 49. Talk to your health care provider about which screenings and vaccines you need and how often you need them. This information is not intended to replace advice given to you by your health care provider. Make sure you discuss any questions you have with your health care provider. Document Released: 04/13/2015 Document Revised: 12/05/2015 Document Reviewed: 01/16/2015 Elsevier Interactive Patient Education  2017 [place] Prevention in the Home Falls can cause injuries. They can happen to people of all ages. There are many things you can do to make your home safe and to help prevent falls. What can I do on the outside of my home? Regularly fix the edges of walkways and driveways and fix any cracks. Remove anything that might make you trip as you walk through a door, such as a raised step or threshold. Trim any bushes or trees on the path to your home. Use bright outdoor lighting. Clear any walking paths of anything that might make someone trip, such as rocks or tools. Regularly check to see if handrails are loose or broken. Make sure that both sides of any steps have handrails. Any raised decks and porches should have guardrails on the edges. Have any leaves, snow, or ice cleared regularly. Use sand or salt on walking paths during winter. Clean up any spills in your garage right away. This includes oil or grease spills. What can I do in the bathroom? Use night lights. Install grab bars by the toilet and in the tub and shower. Do not use towel bars as grab bars. Use non-skid mats or decals in the tub or shower. If you need to sit down in the shower, use a plastic, non-slip stool. Keep the floor dry. Clean up any water that spills on the floor as soon as it happens. Remove soap buildup in  the tub or shower regularly. Attach bath mats securely with double-sided non-slip rug tape. Do not have throw rugs and other things on the floor that can make you trip. What can I do in the bedroom? Use night lights. Make sure that you have a light by your bed that is easy to reach. Do not use any sheets or blankets that are too big for your bed. They should not hang down onto the floor. Have a firm chair that has side arms. You can use this for support while you get dressed. Do not have throw rugs and other things on the floor that can make you trip. What can I do in the kitchen? Clean up any spills right away. Avoid walking on wet floors. Keep items that you use a lot in easy-to-reach places. If you need to reach something above you, use a strong step stool that has a grab bar. Keep electrical cords out of the way. Do not use floor polish or wax that makes floors slippery. If you must use wax, use non-skid floor wax. Do not have throw rugs and other things on the floor that can make you trip.  What can I do with my stairs? Do not leave any items on the stairs. Make sure that there are handrails on both sides of the stairs and use them. Fix handrails that are broken or loose. Make sure that handrails are as long as the stairways. Check any carpeting to make sure that it is firmly attached to the stairs. Fix any carpet that is loose or worn. Avoid having throw rugs at the top or bottom of the stairs. If you do have throw rugs, attach them to the floor with carpet tape. Make sure that you have a light switch at the top of the stairs and the bottom of the stairs. If you do not have them, ask someone to add them for you. What else can I do to help prevent falls? Wear shoes that: Do not have high heels. Have rubber bottoms. Are comfortable and fit you well. Are closed at the toe. Do not wear sandals. If you use a stepladder: Make sure that it is fully opened. Do not climb a closed  stepladder. Make sure that both sides of the stepladder are locked into place. Ask someone to hold it for you, if possible. Clearly mark and make sure that you can see: Any grab bars or handrails. First and last steps. Where the edge of each step is. Use tools that help you move around (mobility aids) if they are needed. These include: Canes. Walkers. Scooters. Crutches. Turn on the lights when you go into a dark area. Replace any light bulbs as soon as they burn out. Set up your furniture so you have a clear path. Avoid moving your furniture around. If any of your floors are uneven, fix them. If there are any pets around you, be aware of where they are. Review your medicines with your doctor. Some medicines can make you feel dizzy. This can increase your chance of falling. Ask your doctor what other things that you can do to help prevent falls. This information is not intended to replace advice given to you by your health care provider. Make sure you discuss any questions you have with your health care provider. Document Released: 01/11/2009 Document Revised: 08/23/2015 Document Reviewed: 04/21/2014 Elsevier Interactive Patient Education  2017 [employer].

## 2022-02-13 NOTE — Progress Notes (Signed)
Subjective:   Claudia Mcguire is a 66 y.o. female who presents for Medicare Annual (Subsequent) preventive examination.  Review of Systems    No ROS.  Medicare Wellness Virtual Visit.  Visual/audio telehealth visit, UTA vital signs.   See social history for additional risk factors.   Cardiac Risk Factors include: advanced age (>64men, >20 women);hypertension     Objective:    Today's Vitals   02/13/22 0859  Weight: 221 lb (100.2 kg)  Height: 5' 8.5" (1.74 m)   Body mass index is 33.11 kg/m.     02/13/2022    9:07 AM  Advanced Directives  Does Patient Have a Medical Advance Directive? Yes  Type of Estate agent of [place];Living will  Does patient want to make changes to medical advance directive? No - Patient declined  Copy of Healthcare Power of Attorney in Chart? Yes - validated most recent copy scanned in chart (See row information)    Current Medications (verified) Outpatient Encounter Medications as of 02/13/2022  Medication Sig   Ascorbic Acid (VITAMIN C) 1000 MG tablet Take 1,000 mg by mouth daily.   Cholecalciferol (VITAMIN D3) 125 MCG (5000 UT) CAPS Take by mouth.   diclofenac Sodium (VOLTAREN) 1 % GEL Apply topically daily as needed.   Flaxseed, Linseed, (FLAX SEED OIL) 1000 MG CAPS Take by mouth.   Multiple Vitamin (MULTIVITAMIN ADULT PO) Take by mouth.   rizatriptan (MAXALT) 10 MG tablet TAKE ONE TABLET BY MOUTH AS NEEDED for migraine- may repeat in 2 hours if needed   vitamin B-12 (CYANOCOBALAMIN) 1000 MCG tablet Take 1,000 mcg by mouth daily.   No facility-administered encounter medications on file as of 02/13/2022.    Allergies (verified) Patient has no known allergies.   History: Past Medical History:  Diagnosis Date   History of headache    History of migraine    History reviewed. No pertinent surgical history. Family History  Problem Relation Age of Onset   Hyperlipidemia Father    Stroke Paternal Grandfather     Diabetes Paternal Grandfather    Stroke Paternal Uncle        2 uncles   Dementia Mother    Social History   Socioeconomic History   Marital status: Single    Spouse name: Not on file   Number of children: Not on file   Years of education: Not on file   Highest education level: Not on file  Occupational History   Not on file  Tobacco Use   Smoking status: Former   Smokeless tobacco: Never   Tobacco comments:    quit back in 1987  Vaping Use   Vaping Use: Never used  Substance and Sexual Activity   Alcohol use: Yes    Alcohol/week: 3.0 - 4.0 standard drinks of alcohol    Types: 3 - 4 Cans of beer per week    Comment: daily   Drug use: No   Sexual activity: Yes  Other Topics Concern   Not on file  Social History Narrative   Lives in [place] to care for elderly parents    Not employed and no health insurance    Plans to have regular medical care when she medicare at 66 yo    Returns here frequently - where her support is    Social Determinants of Corporate investment banker Strain: Low Risk  (02/13/2022)   Overall Financial Resource Strain (CARDIA)    Difficulty of Paying Living Expenses: Not hard at  all  Food Insecurity: No Food Insecurity (02/13/2022)   Hunger Vital Sign    Worried About Running Out of Food in the Last Year: Never true    Ran Out of Food in the Last Year: Never true  Transportation Needs: No Transportation Needs (02/13/2022)   PRAPARE - Administrator, Civil Service (Medical): No    Lack of Transportation (Non-Medical): No  Physical Activity: Not on file  Stress: No Stress Concern Present (02/13/2022)   [name] of Occupational Health - Occupational Stress Questionnaire    Feeling of Stress : Not at all  Social Connections: Unknown (02/13/2022)   Social Connection and Isolation Panel [NHANES]    Frequency of Communication with Friends and Family: More than three times a week    Frequency of Social Gatherings with Friends and  Family: More than three times a week    Attends Religious Services: Not on Marketing executive or Organizations: Not on file    Attends Banker Meetings: Not on file    Marital Status: Not on file    Tobacco Counseling Counseling given: Not Answered Tobacco comments: quit back in 1987   Clinical Intake:  Pre-visit preparation completed: Yes        Diabetes: No  How often do you need to have someone help you when you read instructions, pamphlets, or other written materials from your doctor or pharmacy?: 1 - Never    Interpreter Needed?: No      Activities of Daily Living    02/13/2022    9:04 AM  In your present state of health, do you have any difficulty performing the following activities:  Hearing? 1  Vision? 0  Difficulty concentrating or making decisions? 0  Walking or climbing stairs? 0  Dressing or bathing? 0  Doing errands, shopping? 0  Preparing Food and eating ? N  Using the Toilet? N  In the past six months, have you accidently leaked urine? N  Do you have problems with loss of bowel control? N  Managing your Medications? N  Managing your Finances? N  Housekeeping or managing your Housekeeping? N    Patient Care Team: Tower, Audrie Gallus, MD as PCP - General  Indicate any recent Medical Services you may have received from other than [facility] providers in the past year (date may be approximate).     Assessment:   This is a routine wellness examination for Claudia Mcguire.  I connected with  Claudia Mcguire on 02/13/22 by a audio enabled telemedicine application and verified that I am speaking with the correct person using two identifiers.  Patient Location: Home  Provider Location: Office/Clinic  I discussed the limitations of evaluation and management by telemedicine. The patient expressed understanding and agreed to proceed.   Hearing/Vision screen Hearing Screening - Comments:: Followed by [facility] and Audiolology  [facility] Difficulty hearing L ear Audiology test complete 2022 Does not wear hearing aids Vision Screening - Comments:: Wears readers lenses They have seen their ophthalmologist in the last 12 months.    Dietary issues and exercise activities discussed: Current Exercise Habits: Home exercise routine, Intensity: Mild Regular diet Good water intake     Goals Addressed             This Visit's Progress    Increase physical activity       Join the [employer] Program       Depression Screen    02/13/2022  9:04 AM 02/07/2021   10:53 AM 02/06/2020   12:14 PM 02/06/2020   11:39 AM 12/09/2018    9:23 AM  PHQ 2/9 Scores  PHQ - 2 Score 0 0 0 0 0    Fall Risk    02/13/2022    9:04 AM 02/07/2021   10:53 AM 02/06/2020   12:14 PM 02/06/2020   11:39 AM 12/09/2018    9:24 AM  Fall Risk   Falls in the past year? 0 0 1 0 0  Number falls in past yr: 0  0 0 0  Injury with Fall? 0  0 0 0  Risk for fall due to : No Fall Risks  Orthopedic patient    Follow up Falls evaluation completed;Falls prevention discussed Falls evaluation completed Falls prevention discussed  Falls evaluation completed    FALL RISK PREVENTION PERTAINING TO THE HOME: Home free of loose throw rugs in walkways, pet beds, electrical cords, etc? Yes  Adequate lighting in your home to reduce risk of falls? Yes   ASSISTIVE DEVICES UTILIZED TO PREVENT FALLS: Life alert? No  Use of a cane, walker or w/c? No   TIMED UP AND GO: Was the test performed? No .   Cognitive Function:        02/13/2022    9:07 AM  6CIT Screen  What Year? 0 points  What month? 0 points  What time? 0 points  Count back from 20 0 points  Months in reverse 0 points  Repeat phrase 0 points  Total Score 0 points    Immunizations Immunization History  Administered Date(s) Administered   Influenza Split 01/04/2011, 12/10/2019   Influenza, High Dose Seasonal PF 12/01/2018, 12/31/2020, 01/01/2022   Influenza-Unspecified  12/29/2012   PFIZER(Purple Top)SARS-COV-2 Vaccination 07/04/2019, 07/25/2019, 02/27/2020, 01/01/2022   Pfizer Covid-19 Vaccine Bivalent Booster 40yrs & up 12/31/2020   Pneumococcal Conjugate-13 12/09/2018   Pneumococcal Polysaccharide-23 02/06/2020   Respiratory Syncytial Virus Vaccine,Recomb Aduvanted(Arexvy) 01/01/2022   Td 03/12/2003, 04/02/2007   Tdap 08/04/2017   Zoster Recombinat (Shingrix) 12/10/2019, 04/05/2020   Screening Tests Health Maintenance  Topic Date Due   COVID-19 Vaccine (5 - Pfizer series) 05/04/2022   MAMMOGRAM  05/26/2022   Medicare Annual Wellness (AWV)  02/14/2023   TETANUS/TDAP  08/05/2027   COLONOSCOPY (Pts 45-52yrs Insurance coverage will need to be confirmed)  01/09/2030   Pneumonia Vaccine 101+ Years old  Completed   INFLUENZA VACCINE  Completed   DEXA SCAN  Completed   Hepatitis C Screening  Completed   Zoster Vaccines- Shingrix  Completed   HPV VACCINES  Aged Out    Health Maintenance There are no preventive care reminders to display for this patient.  Vision Screening: Recommended annual ophthalmology exams for early detection of glaucoma and other disorders of the eye.  Dental Screening: Recommended annual dental exams for proper oral hygiene.  Community Resource Referral / Chronic Care Management: CRR required this visit?  No   CCM required this visit?  No      Plan:     I have personally reviewed and noted the following in the patient's chart:   Medical and social history Use of alcohol, tobacco or illicit drugs  Current medications and supplements including opioid prescriptions. Patient is not currently taking opioid prescriptions. Functional ability and status Nutritional status Physical activity Advanced directives List of other physicians Hospitalizations, surgeries, and ER visits in previous 12 months Vitals Screenings to include cognitive, depression, and falls Referrals and appointments  In addition, I have  reviewed  and discussed with patient certain preventive protocols, quality metrics, and best practice recommendations. A written personalized care plan for preventive services as well as general preventive health recommendations were provided to patient.     Cathey EndowDenisa L Motley, LPN   [id number]/2023

## 2022-02-17 ENCOUNTER — Telehealth: Payer: Self-pay | Admitting: Family Medicine

## 2022-02-17 DIAGNOSIS — Z0000 Encounter for general adult medical examination without abnormal findings: Secondary | ICD-10-CM

## 2022-02-17 DIAGNOSIS — Z13220 Encounter for screening for lipoid disorders: Secondary | ICD-10-CM

## 2022-02-17 DIAGNOSIS — R7401 Elevation of levels of liver transaminase levels: Secondary | ICD-10-CM | POA: Insufficient documentation

## 2022-02-17 NOTE — Telephone Encounter (Signed)
-----   Message from Ronalee Red, RT sent at 02/10/2022  2:27 PM EST ----- Regarding: Tue 11/21 labs Patient has lab appt on 02/18/22 for AWV labs.  Order needed, please.  Thanks, Jae Dire

## 2022-02-17 NOTE — Telephone Encounter (Signed)
Please let her know that her insurance may not pay for all the labs Medicare advantage plans don't always -just so she is aware

## 2022-02-18 ENCOUNTER — Other Ambulatory Visit (INDEPENDENT_AMBULATORY_CARE_PROVIDER_SITE_OTHER): Payer: Medicare HMO

## 2022-02-18 DIAGNOSIS — Z0000 Encounter for general adult medical examination without abnormal findings: Secondary | ICD-10-CM

## 2022-02-18 DIAGNOSIS — Z13220 Encounter for screening for lipoid disorders: Secondary | ICD-10-CM | POA: Diagnosis not present

## 2022-02-18 DIAGNOSIS — R7401 Elevation of levels of liver transaminase levels: Secondary | ICD-10-CM

## 2022-02-18 LAB — COMPREHENSIVE METABOLIC PANEL
ALT: 68 U/L — ABNORMAL HIGH (ref 0–35)
AST: 76 U/L — ABNORMAL HIGH (ref 0–37)
Albumin: 4.1 g/dL (ref 3.5–5.2)
Alkaline Phosphatase: 67 U/L (ref 39–117)
BUN: 10 mg/dL (ref 6–23)
CO2: 28 mEq/L (ref 19–32)
Calcium: 9.4 mg/dL (ref 8.4–10.5)
Chloride: 103 mEq/L (ref 96–112)
Creatinine, Ser: 0.77 mg/dL (ref 0.40–1.20)
GFR: 79.32 mL/min (ref >60.00)
Glucose, Bld: 92 mg/dL (ref 70–99)
Potassium: 4.3 mEq/L (ref 3.5–5.1)
Sodium: 137 mEq/L (ref 135–145)
Total Bilirubin: 0.4 mg/dL (ref 0.2–1.2)
Total Protein: 6.7 g/dL (ref 6.0–8.3)

## 2022-02-18 LAB — CBC WITH DIFFERENTIAL/PLATELET
Basophils Absolute: 0.1 10*3/uL (ref 0.0–0.1)
Basophils Relative: 2.5 % (ref 0.0–3.0)
Eosinophils Absolute: 0.2 10*3/uL (ref 0.0–0.7)
Eosinophils Relative: 5.4 % — ABNORMAL HIGH (ref 0.0–5.0)
HCT: 36.3 % (ref 36.0–46.0)
Hemoglobin: 11.7 g/dL — ABNORMAL LOW (ref 12.0–15.0)
Lymphocytes Relative: 43.5 % (ref 12.0–46.0)
Lymphs Abs: 1.9 10*3/uL (ref 0.7–4.0)
MCHC: 32.2 g/dL (ref 30.0–36.0)
MCV: 78.3 fl (ref 78.0–100.0)
Monocytes Absolute: 0.5 10*3/uL (ref 0.1–1.0)
Monocytes Relative: 12.1 % — ABNORMAL HIGH (ref 3.0–12.0)
Neutro Abs: 1.6 10*3/uL (ref 1.4–7.7)
Neutrophils Relative %: 36.5 % — ABNORMAL LOW (ref 43.0–77.0)
Platelets: 370 10*3/uL (ref 150.0–400.0)
RBC: 4.64 Mil/uL (ref 3.87–5.11)
RDW: 18.1 % — ABNORMAL HIGH (ref 11.5–15.5)
WBC: 4.5 10*3/uL (ref 4.0–10.5)

## 2022-02-18 LAB — LIPID PANEL
Cholesterol: 156 mg/dL (ref 0–200)
HDL: 76.5 mg/dL (ref >39.00)
LDL Cholesterol: 69 mg/dL (ref 0–99)
NonHDL: 79.88
Total CHOL/HDL Ratio: 2
Triglycerides: 54 mg/dL (ref 0.0–149.0)
VLDL: 10.8 mg/dL (ref 0.0–40.0)

## 2022-02-18 LAB — TSH: TSH: 1.42 u[IU]/mL (ref 0.35–5.50)

## 2022-02-26 ENCOUNTER — Ambulatory Visit (INDEPENDENT_AMBULATORY_CARE_PROVIDER_SITE_OTHER): Payer: Medicare HMO | Admitting: Family Medicine

## 2022-02-26 ENCOUNTER — Encounter: Payer: Self-pay | Admitting: Family Medicine

## 2022-02-26 ENCOUNTER — Encounter: Payer: Self-pay | Admitting: *Deleted

## 2022-02-26 VITALS — BP 146/82 | HR 75 | Temp 97.2°F | Ht 68.5 in | Wt 225.4 lb

## 2022-02-26 DIAGNOSIS — Z0000 Encounter for general adult medical examination without abnormal findings: Secondary | ICD-10-CM | POA: Diagnosis not present

## 2022-02-26 DIAGNOSIS — M85859 Other specified disorders of bone density and structure, unspecified thigh: Secondary | ICD-10-CM

## 2022-02-26 DIAGNOSIS — Z1211 Encounter for screening for malignant neoplasm of colon: Secondary | ICD-10-CM | POA: Diagnosis not present

## 2022-02-26 DIAGNOSIS — D649 Anemia, unspecified: Secondary | ICD-10-CM | POA: Diagnosis not present

## 2022-02-26 DIAGNOSIS — R7401 Elevation of levels of liver transaminase levels: Secondary | ICD-10-CM

## 2022-02-26 DIAGNOSIS — Z131 Encounter for screening for diabetes mellitus: Secondary | ICD-10-CM

## 2022-02-26 NOTE — Assessment & Plan Note (Signed)
Dexa 06/2021  Taking vit D Enc ca in diet  Enc wt bearing and strength training exercise  Consider next dexa at 2-3 y

## 2022-02-26 NOTE — Assessment & Plan Note (Addendum)
Ast and alt are up slightly  No symptoms No tylenol  2-3 beers per day -enc pt to cut back to 1 or less (even better none)  Will plan abd [place]   Re check lab approx 1 mo

## 2022-02-26 NOTE — Assessment & Plan Note (Signed)
Colonoscopy 2021 saw some ulcers thought to be due to nsaid  Mild anemia today  Heme cards given

## 2022-02-26 NOTE — Assessment & Plan Note (Signed)
Glucose in nl range

## 2022-02-26 NOTE — Assessment & Plan Note (Signed)
Reviewed health habits including diet and exercise and skin cancer prevention Reviewed appropriate screening tests for age  Also reviewed health mt list, fam hx and immunization status , as well as social and family history   See HPI Labs reviewed Mammogram utd 05/2020  Utd gyn care  Colonoscopy 12/2019  Given heme cards for mild anemia  Dexa 06/2021  No falls or fx, taking vit D and enc strength training  Bp mildly elevated- will re check next visit  Enc balanced diet with produce and protein

## 2022-02-26 NOTE — Assessment & Plan Note (Signed)
Hb 11.7 Most likely due to frequent blood donation   Will check heme cards   Re check lab approx 1 month with iron studies

## 2022-02-26 NOTE — Progress Notes (Signed)
Subjective:    Patient ID: Claudia Mcguire, female    DOB: Aug 15, 1955, 66 y.o.   MRN: [id number]  HPI Here for health maintenance exam and to review chronic medical problems    Wt Readings from Last 3 Encounters:  02/26/22 225 lb 6 oz (102.2 kg)  02/13/22 221 lb (100.2 kg)  02/07/21 221 lb (100.2 kg)   33.77 kg/m  Feeling pretty good overall   Immunization History  Administered Date(s) Administered   Influenza Split 01/04/2011, 12/10/2019   Influenza, High Dose Seasonal PF 12/01/2018, 12/31/2020, 01/01/2022   Influenza-Unspecified 12/29/2012   PFIZER(Purple Top)SARS-COV-2 Vaccination 07/04/2019, 07/25/2019, 02/27/2020, 01/01/2022   Pfizer Covid-19 Vaccine Bivalent Booster 6yrs & up 12/31/2020   Pneumococcal Conjugate-13 12/09/2018   Pneumococcal Polysaccharide-23 02/06/2020   Respiratory Syncytial Virus Vaccine,Recomb Aduvanted(Arexvy) 01/01/2022   Td 03/12/2003, 04/02/2007   Tdap 08/04/2017   Zoster Recombinat (Shingrix) 12/10/2019, 04/05/2020   There are no preventive care reminders to display for this patient.  Mammogram 05/2020 Self breast exam: no lumps  She goes to gyn  Does a pap perhaps every other year    Colonoscopy 12/2019  (she did have some ulcers- likely due to nsaid over use)  No blood in stool  No dark stools    Dexa  06/2021  osteopenia   Falls: none Fractures:none  Supplements : vit D  Also multi vitamin  Exercise : good Had hip replacement - walk   BP Readings from Last 3 Encounters:  02/26/22 (!) 146/82  02/07/21 130/80  05/28/20 118/70   Pulse Readings from Last 3 Encounters:  02/26/22 75  02/07/21 73  05/28/20 80     H/o elevated transaminases Lab Results  Component Value Date   ALT 68 (H) 02/18/2022   AST 76 (H) 02/18/2022   ALKPHOS 67 02/18/2022   BILITOT 0.4 02/18/2022   Neg hep C testing in the past   No abd pain  Still has a gallbladder   Alcohol - 2-3 light beer per day  No tylenol   Helped stress-  taking care of her parents     Lab Results  Component Value Date   WBC 4.5 02/18/2022   HGB 11.7 (L) 02/18/2022   HCT 36.3 02/18/2022   MCV 78.3 02/18/2022   PLT 370.0 02/18/2022   Was told she could not donate blood due to iron def  Took some oral iron - does not tolerate well   She donates every 6-8 weeks for last 2 years      Lab Results  Component Value Date   CREATININE 0.77 02/18/2022   BUN 10 02/18/2022   NA 137 02/18/2022   K 4.3 02/18/2022   CL 103 02/18/2022   CO2 28 02/18/2022   Lab Results  Component Value Date   TSH 1.42 02/18/2022   Cholesterol Lab Results  Component Value Date   CHOL 156 02/18/2022   CHOL 184 02/07/2021   CHOL 173 02/01/2020   Lab Results  Component Value Date   HDL 76.50 02/18/2022   HDL 80.30 02/07/2021   HDL 90.60 02/01/2020   Lab Results  Component Value Date   LDLCALC 69 02/18/2022   LDLCALC 94 02/07/2021   LDLCALC 73 02/01/2020   Lab Results  Component Value Date   TRIG 54.0 02/18/2022   TRIG 49.0 02/07/2021   TRIG 47.0 02/01/2020   Lab Results  Component Value Date   CHOLHDL 2 02/18/2022   CHOLHDL 2 02/07/2021   CHOLHDL 2 02/01/2020   No  results found for: "LDLDIRECT"  Avoids a lot of sugar  Not a lot of beef  Profile is improved    Patient Active Problem List   Diagnosis Date Noted   Mild anemia 02/26/2022   Elevated transaminase level 02/17/2022   Hearing loss 02/07/2021   Pre-operative clearance 05/28/2020   Right flank pain 05/28/2020   OA (osteoarthritis) of hip 02/06/2020   Medicare annual wellness visit, subsequent 12/09/2018   Screening mammogram, encounter for 12/09/2018   Estrogen deficiency 12/09/2018   Encounter for hepatitis C screening test for low risk patient 12/09/2018   Colon cancer screening 12/09/2018   Routine general medical examination at a health care facility 08/09/2017   Osteopenia 08/04/2017   Diabetes mellitus screening 08/04/2017   Screening for lipoid disorders  08/04/2017   Migraine with aura 12/01/2006   Past Medical History:  Diagnosis Date   History of headache    History of migraine    History reviewed. No pertinent surgical history. Social History   Tobacco Use   Smoking status: Former   Smokeless tobacco: Never   Tobacco comments:    quit back in 1987  Vaping Use   Vaping Use: Never used  Substance Use Topics   Alcohol use: Yes    Alcohol/week: 3.0 - 4.0 standard drinks of alcohol    Types: 3 - 4 Cans of beer per week    Comment: daily   Drug use: No   Family History  Problem Relation Age of Onset   Hyperlipidemia Father    Stroke Paternal Grandfather    Diabetes Paternal Grandfather    Stroke Paternal Uncle        2 uncles   Dementia Mother    No Known Allergies Current Outpatient Medications on File Prior to Visit  Medication Sig Dispense Refill   Ascorbic Acid (VITAMIN C) 1000 MG tablet Take 1,000 mg by mouth daily.     Cholecalciferol (VITAMIN D3) 125 MCG (5000 UT) CAPS Take by mouth.     diclofenac Sodium (VOLTAREN) 1 % GEL Apply topically daily as needed.     Flaxseed, Linseed, (FLAX SEED OIL) 1000 MG CAPS Take by mouth.     Multiple Vitamin (MULTIVITAMIN ADULT PO) Take by mouth.     rizatriptan (MAXALT) 10 MG tablet TAKE ONE TABLET BY MOUTH AS NEEDED for migraine- may repeat in 2 hours if needed 10 tablet 11   vitamin B-12 (CYANOCOBALAMIN) 1000 MCG tablet Take 1,000 mcg by mouth daily.     No current facility-administered medications on file prior to visit.    Review of Systems  Constitutional:  Negative for activity change, appetite change, fatigue, fever and unexpected weight change.  HENT:  Negative for congestion, ear pain, rhinorrhea, sinus pressure and sore throat.   Eyes:  Negative for pain, redness and visual disturbance.  Respiratory:  Negative for cough, shortness of breath and wheezing.   Cardiovascular:  Negative for chest pain and palpitations.  Gastrointestinal:  Negative for abdominal pain,  blood in stool, constipation and diarrhea.  Endocrine: Negative for polydipsia and polyuria.  Genitourinary:  Negative for dysuria, frequency and urgency.  Musculoskeletal:  Negative for arthralgias, back pain and myalgias.  Skin:  Negative for pallor and rash.  Allergic/Immunologic: Negative for environmental allergies.  Neurological:  Negative for dizziness, syncope and headaches.  Hematological:  Negative for adenopathy. Does not bruise/bleed easily.  Psychiatric/Behavioral:  Negative for decreased concentration and dysphoric mood. The patient is not nervous/anxious.  Objective:   Physical Exam Constitutional:      General: She is not in acute distress.    Appearance: Normal appearance. She is well-developed. She is obese. She is not ill-appearing or diaphoretic.  HENT:     Head: Normocephalic and atraumatic.     Right Ear: Tympanic membrane, ear canal and external ear normal.     Left Ear: Tympanic membrane, ear canal and external ear normal.     Nose: Nose normal. No congestion.     Mouth/Throat:     Mouth: Mucous membranes are moist.     Pharynx: Oropharynx is clear. No posterior oropharyngeal erythema.  Eyes:     General: No scleral icterus.    Extraocular Movements: Extraocular movements intact.     Conjunctiva/sclera: Conjunctivae normal.     Pupils: Pupils are equal, round, and reactive to light.  Neck:     Thyroid: No thyromegaly.     Vascular: No carotid bruit or JVD.  Cardiovascular:     Rate and Rhythm: Normal rate and regular rhythm.     Pulses: Normal pulses.     Heart sounds: Normal heart sounds.     No gallop.  Pulmonary:     Effort: Pulmonary effort is normal. No respiratory distress.     Breath sounds: Normal breath sounds. No wheezing.     Comments: Good air exch Chest:     Chest wall: No tenderness.  Abdominal:     General: Abdomen is protuberant. Bowel sounds are normal. There is no distension or abdominal bruit.     Palpations: Abdomen is  soft. There is no fluid wave, hepatomegaly, splenomegaly or mass.     Tenderness: There is no abdominal tenderness. There is no guarding or rebound. Negative signs include Murphy's sign and McBurney's sign.     Hernia: No hernia is present.  Genitourinary:    Comments: Breast exam: No mass, nodules, thickening, tenderness, bulging, retraction, inflamation, nipple discharge or skin changes noted.  No axillary or clavicular LA.     Musculoskeletal:        General: No tenderness. Normal range of motion.     Cervical back: Normal range of motion and neck supple. No rigidity. No muscular tenderness.     Right lower leg: No edema.     Left lower leg: No edema.     Comments: No kyphosis   Lymphadenopathy:     Cervical: No cervical adenopathy.  Skin:    General: Skin is warm and dry.     Coloration: Skin is not pale.     Findings: No erythema or rash.  Neurological:     Mental Status: She is alert. Mental status is at baseline.     Cranial Nerves: No cranial nerve deficit.     Motor: No abnormal muscle tone.     Coordination: Coordination normal.     Gait: Gait normal.     Deep Tendon Reflexes: Reflexes are normal and symmetric.  Psychiatric:        Mood and Affect: Mood normal.        Cognition and Memory: Cognition and memory normal.           Assessment & Plan:   Problem List Items Addressed This Visit       Musculoskeletal and Integument   Osteopenia    Dexa 06/2021  Taking vit D Enc ca in diet  Enc wt bearing and strength training exercise  Consider next dexa at 2-3 y  Other   Colon cancer screening    Colonoscopy 2021 saw some ulcers thought to be due to nsaid  Mild anemia today  Heme cards given        Diabetes mellitus screening    Glucose in nl range      Elevated transaminase level    Ast and alt are up slightly  No symptoms No tylenol  2-3 beers per day -enc pt to cut back to 1 or less (even better none)  Will plan abd [place]   Re check lab  approx 1 mo       Mild anemia    Hb 11.7 Most likely due to frequent blood donation   Will check heme cards   Re check lab approx 1 month with iron studies       Relevant Orders   Hemoccult Cards (X3 cards)   Routine general medical examination at a health care facility - Primary    Reviewed health habits including diet and exercise and skin cancer prevention Reviewed appropriate screening tests for age  Also reviewed health mt list, fam hx and immunization status , as well as social and family history   See HPI Labs reviewed Mammogram utd 05/2020  Utd gyn care  Colonoscopy 12/2019  Given heme cards for mild anemia  Dexa 06/2021  No falls or fx, taking vit D and enc strength training  Bp mildly elevated- will re check next visit  Enc balanced diet with produce and protein

## 2022-02-26 NOTE — Patient Instructions (Addendum)
Hold iron for now  Do the stool cards to test for blood in your stool  Let's re check your blood count in 1-2 months with liver tests  Hold off on blood donation for now   Keep walking  Any added strength training is very good for your bones   Please try and cut back on alcohol  1 drink per day or less   I will put an order in for an ultrasound in [place]  If you don't get a call in 1-2 weeks let us know

## 2022-03-03 ENCOUNTER — Other Ambulatory Visit (INDEPENDENT_AMBULATORY_CARE_PROVIDER_SITE_OTHER): Payer: Medicare HMO

## 2022-03-03 ENCOUNTER — Other Ambulatory Visit: Payer: Self-pay | Admitting: Family Medicine

## 2022-03-03 DIAGNOSIS — D649 Anemia, unspecified: Secondary | ICD-10-CM

## 2022-03-03 MED ORDER — RIZATRIPTAN BENZOATE 10 MG PO TABS: ORAL_TABLET | ORAL | 11 refills | Status: DC

## 2022-03-03 NOTE — Telephone Encounter (Signed)
CPE 02/26/22, last filled on 11/8/ 21 #10 tabs w/ 11 refills

## 2022-03-03 NOTE — Telephone Encounter (Signed)
  Encourage patient to contact the pharmacy for refills or they can request refills through [facility]  Did the patient contact the pharmacy:  yes   LAST APPOINTMENT DATE:  Please schedule appointment if longer than 1 year  NEXT APPOINTMENT DATE:02/26/2022  MEDICATION:rizatriptan (MAXALT) 10 MG tablet   Is the patient out of medication? yes  If not, how much is left?  Is this a 90 day supply: no  PHARMACY: [id number] - [place] ([place]), [place] - [address] Phone: [phone]  Fax: [phone]      Let patient know to contact pharmacy at the end of the day to make sure medication is ready.  Please notify patient to allow 48-72 hours to process

## 2022-03-04 LAB — HEMOCCULT SLIDES (X 3 CARDS)
Fecal Occult Blood: NEGATIVE
OCCULT 1: NEGATIVE
OCCULT 2: NEGATIVE
OCCULT 3: NEGATIVE
OCCULT 4: NEGATIVE
OCCULT 5: NEGATIVE

## 2022-03-12 ENCOUNTER — Encounter: Payer: Self-pay | Admitting: Family Medicine

## 2022-03-12 ENCOUNTER — Ambulatory Visit
Admission: RE | Admit: 2022-03-12 | Discharge: 2022-03-12 | Disposition: A | Discharge status: Home / Self Care | Payer: Medicare HMO | Source: Ambulatory Visit | Attending: Family Medicine | Admitting: Family Medicine

## 2022-03-12 DIAGNOSIS — K8020 Calculus of gallbladder without cholecystitis without obstruction: Secondary | ICD-10-CM | POA: Diagnosis not present

## 2022-03-12 DIAGNOSIS — K760 Fatty (change of) liver, not elsewhere classified: Secondary | ICD-10-CM | POA: Diagnosis not present

## 2022-03-12 DIAGNOSIS — R7401 Elevation of levels of liver transaminase levels: Secondary | ICD-10-CM

## 2022-04-23 ENCOUNTER — Other Ambulatory Visit (INDEPENDENT_AMBULATORY_CARE_PROVIDER_SITE_OTHER): Payer: Medicare HMO

## 2022-04-23 DIAGNOSIS — D649 Anemia, unspecified: Secondary | ICD-10-CM

## 2022-04-23 DIAGNOSIS — R7401 Elevation of levels of liver transaminase levels: Secondary | ICD-10-CM | POA: Diagnosis not present

## 2022-04-23 LAB — HEPATIC FUNCTION PANEL
ALT: 51 U/L — ABNORMAL HIGH (ref 0–35)
AST: 58 U/L — ABNORMAL HIGH (ref 0–37)
Albumin: 4.2 g/dL (ref 3.5–5.2)
Alkaline Phosphatase: 61 U/L (ref 39–117)
Bilirubin, Direct: 0.2 mg/dL (ref 0.0–0.3)
Total Bilirubin: 0.5 mg/dL (ref 0.2–1.2)
Total Protein: 7.3 g/dL (ref 6.0–8.3)

## 2022-04-23 LAB — CBC WITH DIFFERENTIAL/PLATELET
Basophils Absolute: 0.1 10*3/uL (ref 0.0–0.1)
Basophils Relative: 1.3 % (ref 0.0–3.0)
Eosinophils Absolute: 0.2 10*3/uL (ref 0.0–0.7)
Eosinophils Relative: 3.7 % (ref 0.0–5.0)
HCT: 40.1 % (ref 36.0–46.0)
Hemoglobin: 13.2 g/dL (ref 12.0–15.0)
Lymphocytes Relative: 45.6 % (ref 12.0–46.0)
Lymphs Abs: 2 10*3/uL (ref 0.7–4.0)
MCHC: 33 g/dL (ref 30.0–36.0)
MCV: 82.5 fl (ref 78.0–100.0)
Monocytes Absolute: 0.6 10*3/uL (ref 0.1–1.0)
Monocytes Relative: 12.7 % — ABNORMAL HIGH (ref 3.0–12.0)
Neutro Abs: 1.6 10*3/uL (ref 1.4–7.7)
Neutrophils Relative %: 36.7 % — ABNORMAL LOW (ref 43.0–77.0)
Platelets: 296 10*3/uL (ref 150.0–400.0)
RBC: 4.86 Mil/uL (ref 3.87–5.11)
RDW: 20.9 % — ABNORMAL HIGH (ref 11.5–15.5)
WBC: 4.5 10*3/uL (ref 4.0–10.5)

## 2022-04-23 LAB — IRON: Iron: 109 ug/dL (ref 42–145)

## 2022-04-23 LAB — FERRITIN: Ferritin: 16.2 ng/mL (ref 10.0–291.0)

## 2022-04-24 ENCOUNTER — Telehealth: Payer: Self-pay | Admitting: *Deleted

## 2022-04-24 DIAGNOSIS — K760 Fatty (change of) liver, not elsewhere classified: Secondary | ICD-10-CM

## 2022-04-24 DIAGNOSIS — R7401 Elevation of levels of liver transaminase levels: Secondary | ICD-10-CM

## 2022-04-24 NOTE — Telephone Encounter (Signed)
Left VM requesting pt to call the office back 

## 2022-04-24 NOTE — Telephone Encounter (Signed)
-----  Message from Abner Greenspan, MD sent at 04/23/2022  7:54 PM EST ----- Cbc is better  Iron level is reassuring   Your liver tests are still elevated (down just a bit)  Please cut your alcohol content as much as you can  If gallstones cause any symptoms let us know  It would be a good idea to set you up with a GI specialist in the future for the fatty liver  Is that something you would be open to?

## 2022-04-25 NOTE — Telephone Encounter (Signed)
Patient called back in returning a call she received. Thank you! 

## 2022-04-25 NOTE — Telephone Encounter (Signed)
Left VM requesting pt to call the office back 

## 2022-04-28 DIAGNOSIS — K760 Fatty (change of) liver, not elsewhere classified: Secondary | ICD-10-CM | POA: Insufficient documentation

## 2022-04-28 NOTE — Addendum Note (Signed)
Addended by: Loura Pardon A on: 04/28/2022 07:59 PM   Modules accepted: Orders

## 2022-04-28 NOTE — Telephone Encounter (Signed)
Called patient reviewed all information and repeated back to me. Will call if any questions.  Patient is open to seeing GI In [place]. Patient will be out of the country until 2/11. Would like call after that to set up appointment with GI.

## 2022-04-28 NOTE — Telephone Encounter (Signed)
I put the referral in   Please give her the # to call and schedule  [facility]  [phone]

## 2022-04-28 NOTE — Telephone Encounter (Signed)
[facility] Night - Client Nonclinical Telephone Record  [facility] Client [facility] Night - Client Client Site [place] Provider Loura Pardon - MD Contact Type Call Who Is Calling Patient / Member / Family / Caregiver Caller Name Delano Phone Number [phone] Call Type Message Only Information Provided Reason for Call Returning a Call from the Office Initial [place] states she is returning a call Additional Comment Office hours provided Disp. Time Disposition Final User 04/25/2022 5:02:26 PM General Information Provided Yes Netta Corrigan Call Closed By: Netta Corrigan Transaction Date/Time: 04/25/2022 4:59:40 PM (ET

## 2022-04-29 NOTE — Telephone Encounter (Signed)
Left message to return call to our office.  

## 2022-06-09 DIAGNOSIS — K8020 Calculus of gallbladder without cholecystitis without obstruction: Secondary | ICD-10-CM | POA: Diagnosis not present

## 2022-06-09 DIAGNOSIS — K760 Fatty (change of) liver, not elsewhere classified: Secondary | ICD-10-CM | POA: Diagnosis not present

## 2022-06-09 DIAGNOSIS — R1032 Left lower quadrant pain: Secondary | ICD-10-CM | POA: Diagnosis not present

## 2022-09-28 ENCOUNTER — Telehealth: Payer: Self-pay | Admitting: Family Medicine

## 2022-09-28 DIAGNOSIS — H9193 Unspecified hearing loss, bilateral: Secondary | ICD-10-CM

## 2022-09-28 NOTE — Telephone Encounter (Signed)
The referral is in.

## 2022-10-01 ENCOUNTER — Telehealth: Payer: Self-pay | Admitting: Family Medicine

## 2022-10-01 NOTE — Telephone Encounter (Signed)
Faxed new referral to [facility]. Sent MyChart message to pt advising of referral and contact information for her to call them and schedule.

## 2022-10-01 NOTE — Telephone Encounter (Signed)
Aware I assume patient knows but if not please alert her Let me know if I need to do anything further

## 2022-10-01 NOTE — Telephone Encounter (Signed)
[facility] called stating that they received referral for patient ,however they will not be able to provide her with hearing aids so they will be no need for her to come there to see them.

## 2022-10-20 ENCOUNTER — Telehealth: Payer: Self-pay

## 2022-10-25 DIAGNOSIS — H9190 Unspecified hearing loss, unspecified ear: Secondary | ICD-10-CM | POA: Diagnosis not present

## 2023-02-17 ENCOUNTER — Ambulatory Visit (INDEPENDENT_AMBULATORY_CARE_PROVIDER_SITE_OTHER): Payer: Medicare HMO

## 2023-02-17 VITALS — Wt 225.0 lb

## 2023-02-17 DIAGNOSIS — Z1231 Encounter for screening mammogram for malignant neoplasm of breast: Secondary | ICD-10-CM

## 2023-02-17 DIAGNOSIS — Z0000 Encounter for general adult medical examination without abnormal findings: Secondary | ICD-10-CM

## 2023-02-17 NOTE — Progress Notes (Signed)
Subjective:   Claudia Mcguire is a 67 y.o. female who presents for Medicare Annual (Subsequent) preventive examination.  Visit Complete: Virtual I connected with  Claudia Mcguire on 02/17/23 by a audio enabled telemedicine application and verified that I am speaking with the correct person using two identifiers.  Patient Location: Home  Provider Location: Office/Clinic  I discussed the limitations of evaluation and management by telemedicine. The patient expressed understanding and agreed to proceed.  Vital Signs: Because this visit was a virtual/telehealth visit, some criteria may be missing or patient reported. Any vitals not documented were not able to be obtained and vitals that have been documented are patient reported.  Patient Medicare AWV questionnaire was completed by the patient on 02/13/23; I have confirmed that all information answered by patient is correct and no changes since this date.  Cardiac Risk Factors include: advanced age (>35men, >54 women)     Objective:    Today's Vitals   02/17/23 1056  Weight: 225 lb (102.1 kg)   Body mass index is 33.71 kg/m.     02/17/2023   10:59 AM 02/13/2022    9:07 AM  Advanced Directives  Does Patient Have a Medical Advance Directive? Yes Yes  Type of Estate agent of [place];Living will Healthcare Power of [place];Living will  Does patient want to make changes to medical advance directive? No - Patient declined No - Patient declined  Copy of Healthcare Power of Attorney in Chart? Yes - validated most recent copy scanned in chart (See row information) Yes - validated most recent copy scanned in chart (See row information)    Current Medications (verified) Outpatient Encounter Medications as of 02/17/2023  Medication Sig   Cholecalciferol (VITAMIN D3) 125 MCG (5000 UT) CAPS Take by mouth.   diclofenac Sodium (VOLTAREN) 1 % GEL Apply topically daily as needed.   Flaxseed, Linseed, (FLAX  SEED OIL) 1000 MG CAPS Take by mouth.   Multiple Vitamin (MULTIVITAMIN ADULT PO) Take by mouth.   rizatriptan (MAXALT) 10 MG tablet TAKE ONE TABLET BY MOUTH AS NEEDED for migraine- may repeat in 2 hours if needed   vitamin B-12 (CYANOCOBALAMIN) 1000 MCG tablet Take 1,000 mcg by mouth daily.   [DISCONTINUED] Ascorbic Acid (VITAMIN C) 1000 MG tablet Take 1,000 mg by mouth daily.   No facility-administered encounter medications on file as of 02/17/2023.    Allergies (verified) Patient has no known allergies.   History: Past Medical History:  Diagnosis Date   History of headache    History of migraine    History reviewed. No pertinent surgical history. Family History  Problem Relation Age of Onset   Hyperlipidemia Father    Stroke Paternal Grandfather    Diabetes Paternal Grandfather    Stroke Paternal Uncle        2 uncles   Dementia Mother    Social History   Socioeconomic History   Marital status: Single    Spouse name: Not on file   Number of children: Not on file   Years of education: Not on file   Highest education level: Not on file  Occupational History   Not on file  Tobacco Use   Smoking status: Former   Smokeless tobacco: Never   Tobacco comments:    quit back in 1987  Vaping Use   Vaping status: Never Used  Substance and Sexual Activity   Alcohol use: Yes    Alcohol/week: 3.0 - 4.0 standard drinks of alcohol  Types: 3 - 4 Cans of beer per week    Comment: daily   Drug use: No   Sexual activity: Yes  Other Topics Concern   Not on file  Social History Narrative   Lives in [place] to care for elderly parents    Not employed and no health insurance    Plans to have regular medical care when she medicare at 67 yo    Returns here frequently - where her support is    Social Determinants of Corporate investment banker Strain: Low Risk  (02/13/2023)   Overall Financial Resource Strain (CARDIA)    Difficulty of Paying Living Expenses: Not very hard  Food  Insecurity: No Food Insecurity (02/13/2023)   Hunger Vital Sign    Worried About Running Out of Food in the Last Year: Never true    Ran Out of Food in the Last Year: Never true  Transportation Needs: No Transportation Needs (02/13/2023)   PRAPARE - Administrator, Civil Service (Medical): No    Lack of Transportation (Non-Medical): No  Physical Activity: Insufficiently Active (02/13/2023)   Exercise Vital Sign    Days of Exercise per Week: 3 days    Minutes of Exercise per Session: 30 min  Stress: No Stress Concern Present (02/13/2023)   [name] of Occupational Health - Occupational Stress Questionnaire    Feeling of Stress : Only a little  Social Connections: Socially Isolated (02/13/2023)   Social Connection and Isolation Panel [NHANES]    Frequency of Communication with Friends and Family: More than three times a week    Frequency of Social Gatherings with Friends and Family: More than three times a week    Attends Religious Services: Never    Database administrator or Organizations: No    Attends Engineer, structural: Never    Marital Status: Never married    Tobacco Counseling Counseling given: Not Answered Tobacco comments: quit back in 1987   Clinical Intake:  Pre-visit preparation completed: Yes  Pain : No/denies pain     BMI - recorded: 33.71 Nutritional Status: BMI > 30  Obese Nutritional Risks: None Diabetes: No  How often do you need to have someone help you when you read instructions, pamphlets, or other written materials from your doctor or pharmacy?: 1 - Never  Interpreter Needed?: No  Information entered by :: Lanier Ensign, LPN   Activities of Daily Living    02/13/2023    1:30 PM  In your present state of health, do you have any difficulty performing the following activities:  Hearing? 1  Comment hearing aids  Vision? 0  Difficulty concentrating or making decisions? 0  Walking or climbing stairs? 0  Dressing  or bathing? 0  Doing errands, shopping? 0  Preparing Food and eating ? N  Using the Toilet? N  In the past six months, have you accidently leaked urine? Y  Comment at times a panty liner  Do you have problems with loss of bowel control? N  Managing your Medications? N  Managing your Finances? N  Housekeeping or managing your Housekeeping? N    Patient Care Team: Tower, Audrie Gallus, MD as PCP - General  Indicate any recent Medical Services you may have received from other than [facility] providers in the past year (date may be approximate).     Assessment:   This is a routine wellness examination for [place].  Hearing/Vision screen Hearing Screening - Comments:: Pt has hearing aids  Vision Screening - Comments:: Pt follows up with provider for eye exams    Goals Addressed             This Visit's Progress    Patient Stated       Continue to work on losing with with hopes to start on new medication        Depression Screen    02/17/2023   11:01 AM 02/13/2022    9:04 AM 02/07/2021   10:53 AM 02/06/2020   12:14 PM 02/06/2020   11:39 AM 12/09/2018    9:23 AM  PHQ 2/9 Scores  PHQ - 2 Score 0 0 0 0 0 0    Fall Risk    02/13/2023    1:30 PM 02/13/2022    9:04 AM 02/07/2021   10:53 AM 02/06/2020   12:14 PM 02/06/2020   11:39 AM  Fall Risk   Falls in the past year? 0 0 0 1 0  Number falls in past yr: 0 0  0 0  Injury with Fall? 0 0  0 0  Risk for fall due to : No Fall Risks No Fall Risks  Orthopedic patient   Follow up Falls prevention discussed Falls evaluation completed;Falls prevention discussed Falls evaluation completed Falls prevention discussed     MEDICARE RISK AT HOME: Medicare Risk at Home Any stairs in or around the home?: Yes If so, are there any without handrails?: Yes Home free of loose throw rugs in walkways, pet beds, electrical cords, etc?: Yes Adequate lighting in your home to reduce risk of falls?: Yes Life alert?: No Use of a cane, walker or w/c?:  No Grab bars in the bathroom?: Yes Shower chair or bench in shower?: No Elevated toilet seat or a handicapped toilet?: Yes  TIMED UP AND GO:  Was the test performed?  No    Cognitive Function:        02/17/2023   11:06 AM 02/13/2022    9:07 AM  6CIT Screen  What Year? 0 points 0 points  What month? 0 points 0 points  What time? 0 points 0 points  Count back from 20 0 points 0 points  Months in reverse 0 points 0 points  Repeat phrase 0 points 0 points  Total Score 0 points 0 points    Immunizations Immunization History  Administered Date(s) Administered   Influenza Split 01/04/2011, 12/10/2019   Influenza, High Dose Seasonal PF 12/01/2018, 12/31/2020, 01/01/2022   Influenza-Unspecified 12/29/2012, 01/02/2023   PFIZER(Purple Top)SARS-COV-2 Vaccination 07/04/2019, 07/25/2019, 02/27/2020, 01/01/2022   Pfizer Covid-19 Vaccine Bivalent Booster 74yrs & up 12/31/2020   Pneumococcal Conjugate-13 12/09/2018   Pneumococcal Polysaccharide-23 02/06/2020   Respiratory Syncytial Virus Vaccine,Recomb Aduvanted(Arexvy) 01/01/2022   Td 03/12/2003, 04/02/2007   Tdap 08/04/2017   Zoster Recombinant(Shingrix) 12/10/2019, 04/05/2020    TDAP status: Up to date  Flu Vaccine status: Up to date  Pneumococcal vaccine status: Up to date  Covid-19 vaccine status: Information provided on how to obtain vaccines.   Qualifies for Shingles Vaccine? Yes   Zostavax completed Yes   Shingrix Completed?: Yes  Screening Tests Health Maintenance  Topic Date Due   MAMMOGRAM  05/26/2022   COVID-19 Vaccine (6 - 2023-24 season) 11/30/2022   Medicare Annual Wellness (AWV)  02/17/2024   DTaP/Tdap/Td (4 - Td or Tdap) 08/05/2027   Colonoscopy  01/09/2030   Pneumonia Vaccine 56+ Years old  Completed   INFLUENZA VACCINE  Completed   DEXA SCAN  Completed   Hepatitis C Screening  Completed   Zoster Vaccines- Shingrix  Completed   HPV VACCINES  Aged Out    Health Maintenance  Health Maintenance  Due  Topic Date Due   MAMMOGRAM  05/26/2022   COVID-19 Vaccine (6 - 2023-24 season) 11/30/2022    Colorectal cancer screening: Type of screening: Colonoscopy. Completed 01/10/20. Repeat every 10 years  Mammogram status: Ordered 02/17/23. Pt provided with contact info and advised to call to schedule appt.   Bone Density status: Completed 07/18/21. Results reflect: Bone density results: OSTEOPENIA. Repeat every 2 years.  Additional Screening:  Hepatitis C Screening: Completed 12/09/18  Vision Screening: Recommended annual ophthalmology exams for early detection of glaucoma and other disorders of the eye. Is the patient up to date with their annual eye exam?  Yes  Who is the provider or what is the name of the office in which the patient attends annual eye exams? Provider annually  If pt is not established with a provider, would they like to be referred to a provider to establish care? No .   Dental Screening: Recommended annual dental exams for proper oral hygiene    Community Resource Referral / Chronic Care Management: CRR required this visit?  No   CCM required this visit?  No     Plan:     I have personally reviewed and noted the following in the patient's chart:   Medical and social history Use of alcohol, tobacco or illicit drugs  Current medications and supplements including opioid prescriptions. Patient is not currently taking opioid prescriptions. Functional ability and status Nutritional status Physical activity Advanced directives List of other physicians Hospitalizations, surgeries, and ER visits in previous 12 months Vitals Screenings to include cognitive, depression, and falls Referrals and appointments  In addition, I have reviewed and discussed with patient certain preventive protocols, quality metrics, and best practice recommendations. A written personalized care plan for preventive services as well as general preventive health recommendations were  provided to patient.     Marzella Schlein, LPN   [id number]/14/7829   After Visit Summary: (MyChart) Due to this being a telephonic visit, the after visit summary with patients personalized plan was offered to patient via MyChart   Nurse Notes: none

## 2023-02-17 NOTE — Patient Instructions (Signed)
Claudia Mcguire , Thank you for taking time to come for your Medicare Wellness Visit. I appreciate your ongoing commitment to your health goals. Please review the following plan we discussed and let me know if I can assist you in the future.   Referrals/Orders/Follow-Ups/Clinician Recommendations: Aim for 30 minutes of exercise or brisk walking, 6-8 glasses of water, and 5 servings of fruits and vegetables each day. Will discuss with provider about weight loss medication management    This is a list of the screening recommended for you and due dates:  Health Maintenance  Topic Date Due   Mammogram  05/26/2022   Flu Shot  10/30/2022   COVID-19 Vaccine (6 - 2023-24 season) 11/30/2022   Medicare Annual Wellness Visit  02/17/2024   DTaP/Tdap/Td vaccine (4 - Td or Tdap) 08/05/2027   Colon Cancer Screening  01/09/2030   Pneumonia Vaccine  Completed   DEXA scan (bone density measurement)  Completed   Hepatitis C Screening  Completed   Zoster (Shingles) Vaccine  Completed   HPV Vaccine  Aged Out    Advanced directives: (In Chart) A copy of your advanced directives are scanned into your chart should your provider ever need it.  Next Medicare Annual Wellness Visit scheduled for next year: Yes

## 2023-02-23 NOTE — Progress Notes (Unsigned)
Subjective:    Patient ID: Claudia Mcguire, female    DOB: 08-05-1955, 67 y.o.   MRN: [id number]  HPI  Here for health maintenance exam and to review chronic medical problems   Wt Readings from Last 3 Encounters:  02/24/23 228 lb 8 oz (103.6 kg)  02/17/23 225 lb (102.1 kg)  02/26/22 225 lb 6 oz (102.2 kg)   34.49 kg/m  Vitals:   02/24/23 0945  BP: 130/78  Pulse: 72  Temp: 98.1 F (36.7 C)  SpO2: 96%    Immunization History  Administered Date(s) Administered   Influenza Split 01/04/2011, 12/10/2019   Influenza, High Dose Seasonal PF 12/01/2018, 12/31/2020, 01/01/2022   Influenza-Unspecified 12/29/2012, 01/02/2023   PFIZER(Purple Top)SARS-COV-2 Vaccination 07/04/2019, 07/25/2019, 02/27/2020, 01/01/2022   Pfizer Covid-19 Vaccine Bivalent Booster 42yrs & up 12/31/2020   Pneumococcal Conjugate-13 12/09/2018   Pneumococcal Polysaccharide-23 02/06/2020   Respiratory Syncytial Virus Vaccine,Recomb Aduvanted(Arexvy) 01/01/2022   Td 03/12/2003, 04/02/2007   Tdap 08/04/2017   Zoster Recombinant(Shingrix) 12/10/2019, 04/05/2020    Health Maintenance Due  Topic Date Due   MAMMOGRAM  05/26/2021   Doing well   Mammogram due this month -has appointment with gyn /gets it there Self breast exam: no lumps   [facility]yn health [facility]    Colon cancer screening    Colonoscopy 12/2019 with 10 y recall  Bone health  Dexa  osteopenia [facility]2021  [facility]  Falls none  Fractures-none  Supplements -vitamin D  Last vitamin D No results found for: "25OHVITD2", "25OHVITD3", "VD25OH"  Exercise  Walking  Would enjoy a class     Mood    02/24/2023    9:51 AM 02/17/2023   11:01 AM 02/13/2022    9:04 AM 02/07/2021   10:53 AM 02/06/2020   12:14 PM  Depression screen PHQ 2/9  Decreased Interest 0 0 0 0 0  Down, Depressed, Hopeless 0 0 0 0 0  PHQ - 2 Score 0 0 0 0 0  Altered sleeping 0      Tired, decreased energy 1      Change in appetite 2       Feeling bad or failure about yourself  0      Trouble concentrating 1      Moving slowly or fidgety/restless 0      Suicidal thoughts 0      PHQ-9 Score 4      Difficult doing work/chores Not difficult at all         Past history of fatty liver  Due for labs Lab Results  Component Value Date   ALT 82 (H) 02/24/2023   AST 71 (H) 02/24/2023   ALKPHOS 64 02/24/2023   BILITOT 0.6 02/24/2023    History of migraines  Taking magnesium more now  No real changes           Patient Active Problem List   Diagnosis Date Noted   Body mass index (BMI) 34.0-34.9, adult 02/24/2023   Fatty liver 04/28/2022   Gallstones 03/12/2022   Mild anemia 02/26/2022   Elevated transaminase level 02/17/2022   Hearing loss 02/07/2021   OA (osteoarthritis) of hip 02/06/2020   Medicare annual wellness visit, subsequent 12/09/2018   Screening mammogram, encounter for 12/09/2018   Estrogen deficiency 12/09/2018   Colon cancer screening 12/09/2018   Routine general medical examination at a health care facility 08/09/2017   Osteopenia 08/04/2017   Diabetes mellitus screening 08/04/2017   Screening for lipoid disorders  08/04/2017   Migraine with aura 12/01/2006   Past Medical History:  Diagnosis Date   History of headache    History of migraine    History reviewed. No pertinent surgical history. Social History   Tobacco Use   Smoking status: Former   Smokeless tobacco: Never   Tobacco comments:    quit back in 1987  Vaping Use   Vaping status: Never Used  Substance Use Topics   Alcohol use: Yes    Alcohol/week: 3.0 - 4.0 standard drinks of alcohol    Types: 3 - 4 Cans of beer per week    Comment: daily   Drug use: No   Family History  Problem Relation Age of Onset   Hyperlipidemia Father    Stroke Paternal Grandfather    Diabetes Paternal Grandfather    Stroke Paternal Uncle        2 uncles   Dementia Mother    No Known Allergies Current Outpatient Medications on File  Prior to Visit  Medication Sig Dispense Refill   Cholecalciferol (VITAMIN D3) 125 MCG (5000 UT) CAPS Take by mouth.     diclofenac Sodium (VOLTAREN) 1 % GEL Apply topically daily as needed.     Flaxseed, Linseed, (FLAX SEED OIL) 1000 MG CAPS Take by mouth.     MAGNESIUM PO Take 1 capsule by mouth once a week.     Multiple Vitamin (MULTIVITAMIN ADULT PO) Take by mouth.     rizatriptan (MAXALT) 10 MG tablet TAKE ONE TABLET BY MOUTH AS NEEDED for migraine- may repeat in 2 hours if needed 10 tablet 11   vitamin B-12 (CYANOCOBALAMIN) 1000 MCG tablet Take 1,000 mcg by mouth daily.     No current facility-administered medications on file prior to visit.    Review of Systems  Constitutional:  Negative for activity change, appetite change, fatigue, fever and unexpected weight change.  HENT:  Negative for congestion, ear pain, rhinorrhea, sinus pressure and sore throat.   Eyes:  Negative for pain, redness and visual disturbance.  Respiratory:  Negative for cough, shortness of breath and wheezing.   Cardiovascular:  Negative for chest pain and palpitations.  Gastrointestinal:  Negative for abdominal pain, blood in stool, constipation and diarrhea.  Endocrine: Negative for polydipsia and polyuria.  Genitourinary:  Negative for dysuria, frequency and urgency.  Musculoskeletal:  Negative for arthralgias, back pain and myalgias.  Skin:  Negative for pallor and rash.  Allergic/Immunologic: Negative for environmental allergies.  Neurological:  Negative for dizziness, syncope and headaches.  Hematological:  Negative for adenopathy. Does not bruise/bleed easily.  Psychiatric/Behavioral:  Negative for decreased concentration and dysphoric mood. The patient is not nervous/anxious.        Objective:   Physical Exam Constitutional:      General: She is not in acute distress.    Appearance: Normal appearance. She is well-developed. She is obese. She is not ill-appearing or diaphoretic.  HENT:     Head:  Normocephalic and atraumatic.     Right Ear: Tympanic membrane, ear canal and external ear normal.     Left Ear: Tympanic membrane, ear canal and external ear normal.     Nose: Nose normal. No congestion.     Mouth/Throat:     Mouth: Mucous membranes are moist.     Pharynx: Oropharynx is clear. No posterior oropharyngeal erythema.  Eyes:     General: No scleral icterus.    Extraocular Movements: Extraocular movements intact.     Conjunctiva/sclera: Conjunctivae normal.  Pupils: Pupils are equal, round, and reactive to light.  Neck:     Thyroid: No thyromegaly.     Vascular: No carotid bruit or JVD.  Cardiovascular:     Rate and Rhythm: Normal rate and regular rhythm.     Pulses: Normal pulses.     Heart sounds: Normal heart sounds.     No gallop.  Pulmonary:     Effort: Pulmonary effort is normal. No respiratory distress.     Breath sounds: Normal breath sounds. No wheezing.     Comments: Good air exch Chest:     Chest wall: No tenderness.  Abdominal:     General: Bowel sounds are normal. There is no distension or abdominal bruit.     Palpations: Abdomen is soft. There is no mass.     Tenderness: There is no abdominal tenderness.     Hernia: No hernia is present.  Genitourinary:    Comments: Breast exam: No mass, nodules, thickening, tenderness, bulging, retraction, inflamation, nipple discharge or skin changes noted.  No axillary or clavicular LA.     Musculoskeletal:        General: No tenderness. Normal range of motion.     Cervical back: Normal range of motion and neck supple. No rigidity. No muscular tenderness.     Right lower leg: No edema.     Left lower leg: No edema.     Comments: No kyphosis   Lymphadenopathy:     Cervical: No cervical adenopathy.  Skin:    General: Skin is warm and dry.     Coloration: Skin is not pale.     Findings: No erythema or rash.     Comments: Solar lentigines diffusely   Neurological:     Mental Status: She is alert. Mental  status is at baseline.     Cranial Nerves: No cranial nerve deficit.     Motor: No abnormal muscle tone.     Coordination: Coordination normal.     Gait: Gait normal.     Deep Tendon Reflexes: Reflexes are normal and symmetric. Reflexes normal.  Psychiatric:        Mood and Affect: Mood normal.        Cognition and Memory: Cognition and memory normal.           Assessment & Plan:   Problem List Items Addressed This Visit       Digestive   Fatty liver    Labs today  Weight loss encouraged with diet and exercise       Relevant Orders   Comprehensive metabolic panel (Completed)     Musculoskeletal and Integument   Osteopenia    Dexa 06/2021 noted Due 06/2023- she will call for order in march No falls or fractures  Discussed fall prevention, supplements and exercise for bone density          Other   Screening mammogram, encounter for    Plans to get mammogram at upcoming gyn visit (soon)      Screening for lipoid disorders    Lipid panel today  Disc goals for lipids and reasons to control them Rev last labs with pt Rev low sat fat diet in detail       Relevant Orders   Lipid Panel (Completed)   Routine general medical examination at a health care facility - Primary    Reviewed health habits including diet and exercise and skin cancer prevention Reviewed appropriate screening tests for age  Also reviewed health mt  list, fam hx and immunization status , as well as social and family history   See HPI Labs reviewed and ordered Planning mammogram and gyn visit soon  Colonoscopy utd 2021 Dexa due 06/2023 at the [facility] center, instructed to call for order in march Discussed fall prevention, supplements and exercise for bone density  PHQ 4 with current stressors / declines need for help        Mild anemia    Cbc today  Has not donated blood recently        Relevant Orders   CBC with Differential/Platelet (Completed)   Elevated transaminase level    Lab  today  From fatty liver       Relevant Orders   Comprehensive metabolic panel (Completed)   Diabetes mellitus screening    A1c today  Bmi is 34.4   disc imp of low glycemic diet and wt loss to prevent DM2        Relevant Orders   Comprehensive metabolic panel (Completed)   Hemoglobin A1c (Completed)   Colon cancer screening    Colonoscopy 2021 with 10 y recall      Body mass index (BMI) 34.0-34.9, adult    Discussed how this problem influences overall health and the risks it imposes  Reviewed plan for weight loss with lower calorie diet (via better food choices (lower glycemic and portion control) along with exercise building up to or more than 30 minutes 5 days per week including some aerobic activity and strength training

## 2023-02-24 ENCOUNTER — Encounter: Payer: Self-pay | Admitting: Family Medicine

## 2023-02-24 ENCOUNTER — Ambulatory Visit: Payer: Medicare HMO | Admitting: Family Medicine

## 2023-02-24 VITALS — BP 130/78 | HR 72 | Temp 98.1°F | Ht 68.25 in | Wt 228.5 lb

## 2023-02-24 DIAGNOSIS — Z0000 Encounter for general adult medical examination without abnormal findings: Secondary | ICD-10-CM

## 2023-02-24 DIAGNOSIS — K760 Fatty (change of) liver, not elsewhere classified: Secondary | ICD-10-CM | POA: Diagnosis not present

## 2023-02-24 DIAGNOSIS — R7401 Elevation of levels of liver transaminase levels: Secondary | ICD-10-CM | POA: Diagnosis not present

## 2023-02-24 DIAGNOSIS — Z131 Encounter for screening for diabetes mellitus: Secondary | ICD-10-CM | POA: Diagnosis not present

## 2023-02-24 DIAGNOSIS — M85859 Other specified disorders of bone density and structure, unspecified thigh: Secondary | ICD-10-CM

## 2023-02-24 DIAGNOSIS — R7303 Prediabetes: Secondary | ICD-10-CM | POA: Insufficient documentation

## 2023-02-24 DIAGNOSIS — Z13220 Encounter for screening for lipoid disorders: Secondary | ICD-10-CM | POA: Diagnosis not present

## 2023-02-24 DIAGNOSIS — D649 Anemia, unspecified: Secondary | ICD-10-CM

## 2023-02-24 DIAGNOSIS — Z1231 Encounter for screening mammogram for malignant neoplasm of breast: Secondary | ICD-10-CM

## 2023-02-24 DIAGNOSIS — Z6834 Body mass index (BMI) 34.0-34.9, adult: Secondary | ICD-10-CM

## 2023-02-24 DIAGNOSIS — Z1211 Encounter for screening for malignant neoplasm of colon: Secondary | ICD-10-CM

## 2023-02-24 LAB — CBC WITH DIFFERENTIAL/PLATELET
Basophils Absolute: 0.1 10*3/uL (ref 0.0–0.1)
Basophils Relative: 1.4 % (ref 0.0–3.0)
Eosinophils Absolute: 0.2 10*3/uL (ref 0.0–0.7)
Eosinophils Relative: 3.6 % (ref 0.0–5.0)
HCT: 42.4 % (ref 36.0–46.0)
Hemoglobin: 14.3 g/dL (ref 12.0–15.0)
Lymphocytes Relative: 48.2 % — ABNORMAL HIGH (ref 12.0–46.0)
Lymphs Abs: 2.3 10*3/uL (ref 0.7–4.0)
MCHC: 33.7 g/dL (ref 30.0–36.0)
MCV: 93.9 fL (ref 78.0–100.0)
Monocytes Absolute: 0.6 10*3/uL (ref 0.1–1.0)
Monocytes Relative: 11.8 % (ref 3.0–12.0)
Neutro Abs: 1.7 10*3/uL (ref 1.4–7.7)
Neutrophils Relative %: 35 % — ABNORMAL LOW (ref 43.0–77.0)
Platelets: 278 10*3/uL (ref 150.0–400.0)
RBC: 4.51 Mil/uL (ref 3.87–5.11)
RDW: 13.7 % (ref 11.5–15.5)
WBC: 4.7 10*3/uL (ref 4.0–10.5)

## 2023-02-24 LAB — HEMOGLOBIN A1C: Hgb A1c MFr Bld: 5.8 % (ref 4.6–6.5)

## 2023-02-24 LAB — COMPREHENSIVE METABOLIC PANEL
ALT: 82 U/L — ABNORMAL HIGH (ref 0–35)
AST: 71 U/L — ABNORMAL HIGH (ref 0–37)
Albumin: 4.2 g/dL (ref 3.5–5.2)
Alkaline Phosphatase: 64 U/L (ref 39–117)
BUN: 11 mg/dL (ref 6–23)
CO2: 29 meq/L (ref 19–32)
Calcium: 9.9 mg/dL (ref 8.4–10.5)
Chloride: 102 meq/L (ref 96–112)
Creatinine, Ser: 0.67 mg/dL (ref 0.40–1.20)
GFR: 89.24 mL/min (ref >60.00)
Glucose, Bld: 95 mg/dL (ref 70–99)
Potassium: 4.5 meq/L (ref 3.5–5.1)
Sodium: 138 meq/L (ref 135–145)
Total Bilirubin: 0.6 mg/dL (ref 0.2–1.2)
Total Protein: 6.9 g/dL (ref 6.0–8.3)

## 2023-02-24 LAB — LIPID PANEL
Cholesterol: 194 mg/dL (ref 0–200)
HDL: 75.9 mg/dL (ref >39.00)
LDL Cholesterol: 104 mg/dL — ABNORMAL HIGH (ref 0–99)
NonHDL: 118.4
Total CHOL/HDL Ratio: 3
Triglycerides: 70 mg/dL (ref 0.0–149.0)
VLDL: 14 mg/dL (ref 0.0–40.0)

## 2023-02-24 NOTE — Assessment & Plan Note (Signed)
Plans to get mammogram at upcoming gyn visit (soon)

## 2023-02-24 NOTE — Assessment & Plan Note (Signed)
Reviewed health habits including diet and exercise and skin cancer prevention Reviewed appropriate screening tests for age  Also reviewed health mt list, fam hx and immunization status , as well as social and family history   See HPI Labs reviewed and ordered Planning mammogram and gyn visit soon  Colonoscopy utd 2021 Dexa due 06/2023 at the [facility], instructed to call for order in march Discussed fall prevention, supplements and exercise for bone density  PHQ 4 with current stressors / declines need for help

## 2023-02-24 NOTE — Assessment & Plan Note (Signed)
Lipid panel today   Disc goals for lipids and reasons to control them Rev last labs with pt Rev low sat fat diet in detail

## 2023-02-24 NOTE — Assessment & Plan Note (Signed)
Dexa 06/2021 noted Due 06/2023- she will call for order in march No falls or fractures  Discussed fall prevention, supplements and exercise for bone density

## 2023-02-24 NOTE — Assessment & Plan Note (Signed)
Colonoscopy 2021 with 10 y recall

## 2023-02-24 NOTE — Assessment & Plan Note (Signed)
Lab today  From fatty liver

## 2023-02-24 NOTE — Addendum Note (Signed)
Addended by: Shon Millet on: 02/24/2023 09:50 AM   Modules accepted: Orders

## 2023-02-24 NOTE — Assessment & Plan Note (Signed)
A1c today  Bmi is 34.4   disc imp of low glycemic diet and wt loss to prevent DM2

## 2023-02-24 NOTE — Assessment & Plan Note (Signed)
Labs today  Weight loss encouraged with diet and exercise

## 2023-02-24 NOTE — Assessment & Plan Note (Signed)
Discussed how this problem influences overall health and the risks it imposes  Reviewed plan for weight loss with lower calorie diet (via better food choices (lower glycemic and portion control) along with exercise building up to or more than 30 minutes 5 days per week including some aerobic activity and strength training

## 2023-02-24 NOTE — Assessment & Plan Note (Signed)
Cbc today  Has not donated blood recently

## 2023-02-24 NOTE — Patient Instructions (Addendum)
[facility] will be [facility]  Give me a reminder in march to order that   Keep walking Add some strength training to your routine, this is important for bone and brain health and can reduce your risk of falls and help your body use insulin properly and regulate weight  Light weights, exercise bands , and internet videos are a good way to start  Yoga (chair or regular), machines , floor exercises or a gym with machines are also good options   If you tolerate magnesium- can try it daily to prevent headaches  Perhaps 250 to 500 mg  Watch out diarrhea     Try to get most of your carbohydrates from produce (with the exception of white potatoes) and whole grains Eat less bread/pasta/rice/snack foods/cereals/sweets and other items from the middle of the grocery store (processed carbs)   Labs today

## 2023-04-23 DIAGNOSIS — Z1231 Encounter for screening mammogram for malignant neoplasm of breast: Secondary | ICD-10-CM | POA: Diagnosis not present

## 2023-04-23 LAB — HM MAMMOGRAPHY

## 2023-05-25 ENCOUNTER — Other Ambulatory Visit: Payer: Self-pay | Admitting: Family Medicine

## 2023-06-01 ENCOUNTER — Other Ambulatory Visit: Payer: Self-pay | Admitting: Family Medicine

## 2023-06-01 MED ORDER — RIZATRIPTAN BENZOATE 10 MG PO TABS: ORAL_TABLET | ORAL | 5 refills | Status: AC

## 2023-06-01 NOTE — Telephone Encounter (Signed)
 Last filled on 05/25/23 #10 tabs/ 0 reflls   CPE was on 02/24/23

## 2023-06-10 ENCOUNTER — Encounter: Payer: Self-pay | Admitting: Family Medicine

## 2023-07-20 ENCOUNTER — Encounter: Payer: Self-pay | Admitting: Family Medicine

## 2023-07-20 DIAGNOSIS — E2839 Other primary ovarian failure: Secondary | ICD-10-CM

## 2024-02-19 ENCOUNTER — Telehealth: Payer: Self-pay | Admitting: Family Medicine

## 2024-02-19 NOTE — Telephone Encounter (Signed)
 Copied from CRM [id number]. Topic: Appointments - Scheduling Inquiry for Clinic >> Feb 19, 2024 11:49 AM Claudia Mcguire wrote: Reason for CRM: Patient missed AW[facility] app for today at 11:30, next available is January.  Please advise.

## 2024-04-04 ENCOUNTER — Other Ambulatory Visit

## 2024-05-23 ENCOUNTER — Ambulatory Visit (HOSPITAL_BASED_OUTPATIENT_CLINIC_OR_DEPARTMENT_OTHER): Payer: Self-pay

## 2024-05-27 ENCOUNTER — Ambulatory Visit

## 2024-05-27 ENCOUNTER — Encounter: Admitting: Family Medicine
